# Patient Record
Sex: Male | Born: 1935 | Race: White | Hispanic: No | Marital: Married | Smoking: Never smoker
Health system: Southern US, Community
[De-identification: ages and names within clinical notes are randomized; demographics above are authoritative.]

## PROBLEM LIST (undated history)

## (undated) DIAGNOSIS — I2510 Atherosclerotic heart disease of native coronary artery without angina pectoris: Secondary | ICD-10-CM

## (undated) DIAGNOSIS — C801 Malignant (primary) neoplasm, unspecified: Secondary | ICD-10-CM

## (undated) DIAGNOSIS — I639 Cerebral infarction, unspecified: Secondary | ICD-10-CM

## (undated) DIAGNOSIS — I10 Essential (primary) hypertension: Secondary | ICD-10-CM

## (undated) DIAGNOSIS — E785 Hyperlipidemia, unspecified: Secondary | ICD-10-CM

## (undated) HISTORY — PX: CORONARY ARTERY BYPASS GRAFT: SHX141

## (undated) HISTORY — DX: Hyperlipidemia, unspecified: E78.5

## (undated) HISTORY — DX: Essential (primary) hypertension: I10

## (undated) HISTORY — DX: Atherosclerotic heart disease of native coronary artery without angina pectoris: I25.10

---

## 1994-08-30 HISTORY — PX: CORONARY ARTERY BYPASS GRAFT: SHX141

## 2001-07-11 ENCOUNTER — Encounter: Admission: RE | Admit: 2001-07-11 | Discharge: 2001-07-11 | Payer: Self-pay | Admitting: Surgery

## 2001-07-11 ENCOUNTER — Encounter: Payer: Self-pay | Admitting: Surgery

## 2001-07-13 ENCOUNTER — Encounter (INDEPENDENT_AMBULATORY_CARE_PROVIDER_SITE_OTHER): Payer: Self-pay | Admitting: Specialist

## 2001-07-13 ENCOUNTER — Ambulatory Visit (HOSPITAL_BASED_OUTPATIENT_CLINIC_OR_DEPARTMENT_OTHER): Admission: RE | Admit: 2001-07-13 | Discharge: 2001-07-13 | Payer: Self-pay | Admitting: Surgery

## 2001-08-30 HISTORY — PX: TOTAL KNEE ARTHROPLASTY: SHX125

## 2003-12-31 LAB — HM COLONOSCOPY

## 2004-09-14 ENCOUNTER — Ambulatory Visit: Payer: Self-pay | Admitting: Internal Medicine

## 2004-09-21 ENCOUNTER — Ambulatory Visit: Payer: Self-pay | Admitting: Internal Medicine

## 2004-12-21 ENCOUNTER — Ambulatory Visit: Payer: Self-pay | Admitting: Internal Medicine

## 2005-09-13 ENCOUNTER — Ambulatory Visit: Payer: Self-pay | Admitting: Internal Medicine

## 2005-09-20 ENCOUNTER — Ambulatory Visit: Payer: Self-pay | Admitting: Internal Medicine

## 2005-12-13 ENCOUNTER — Ambulatory Visit: Payer: Self-pay | Admitting: Internal Medicine

## 2006-07-07 ENCOUNTER — Ambulatory Visit: Payer: Self-pay | Admitting: Internal Medicine

## 2006-10-05 ENCOUNTER — Ambulatory Visit: Payer: Self-pay | Admitting: Internal Medicine

## 2006-10-05 LAB — CONVERTED CEMR LAB
ALT: 21 units/L (ref 0–40)
AST: 20 units/L (ref 0–37)
Albumin: 3.9 g/dL (ref 3.5–5.2)
Alkaline Phosphatase: 61 units/L (ref 39–117)
BUN: 13 mg/dL (ref 6–23)
Basophils Absolute: 0.1 10*3/uL (ref 0.0–0.1)
Basophils Relative: 1 % (ref 0.0–1.0)
Bilirubin, Direct: 0.2 mg/dL (ref 0.0–0.3)
CO2: 31 meq/L (ref 19–32)
Calcium: 9.5 mg/dL (ref 8.4–10.5)
Chloride: 104 meq/L (ref 96–112)
Cholesterol: 171 mg/dL (ref 0–200)
Creatinine, Ser: 0.8 mg/dL (ref 0.4–1.5)
Eosinophils Absolute: 0.2 10*3/uL (ref 0.0–0.6)
Eosinophils Relative: 3.4 % (ref 0.0–5.0)
GFR calc Af Amer: 123 mL/min
GFR calc non Af Amer: 102 mL/min
Glucose, Bld: 100 mg/dL — ABNORMAL HIGH (ref 70–99)
HCT: 43.9 % (ref 39.0–52.0)
HDL: 41.1 mg/dL (ref >39.0)
Hemoglobin: 15.3 g/dL (ref 13.0–17.0)
LDL Cholesterol: 108 mg/dL — ABNORMAL HIGH (ref 0–99)
Lymphocytes Relative: 27.9 % (ref 12.0–46.0)
MCHC: 34.8 g/dL (ref 30.0–36.0)
MCV: 88.2 fL (ref 78.0–100.0)
Monocytes Absolute: 0.5 10*3/uL (ref 0.2–0.7)
Monocytes Relative: 8.2 % (ref 3.0–11.0)
Neutro Abs: 3.2 10*3/uL (ref 1.4–7.7)
Neutrophils Relative %: 59.5 % (ref 43.0–77.0)
PSA: 2.09 ng/mL (ref 0.10–4.00)
Platelets: 279 10*3/uL (ref 150–400)
Potassium: 4 meq/L (ref 3.5–5.1)
RBC: 4.98 M/uL (ref 4.22–5.81)
RDW: 13.2 % (ref 11.5–14.6)
Sodium: 141 meq/L (ref 135–145)
TSH: 4.48 microintl units/mL (ref 0.35–5.50)
Total Bilirubin: 0.9 mg/dL (ref 0.3–1.2)
Total CHOL/HDL Ratio: 4.2
Total Protein: 6.8 g/dL (ref 6.0–8.3)
Triglycerides: 108 mg/dL (ref 0–149)
VLDL: 22 mg/dL (ref 0–40)
WBC: 5.5 10*3/uL (ref 4.5–10.5)

## 2006-10-13 ENCOUNTER — Ambulatory Visit: Payer: Self-pay | Admitting: Internal Medicine

## 2006-12-16 ENCOUNTER — Ambulatory Visit: Payer: Self-pay | Admitting: Internal Medicine

## 2007-04-27 DIAGNOSIS — I10 Essential (primary) hypertension: Secondary | ICD-10-CM | POA: Insufficient documentation

## 2007-04-27 DIAGNOSIS — E785 Hyperlipidemia, unspecified: Secondary | ICD-10-CM | POA: Insufficient documentation

## 2007-04-27 DIAGNOSIS — I2510 Atherosclerotic heart disease of native coronary artery without angina pectoris: Secondary | ICD-10-CM | POA: Insufficient documentation

## 2007-06-19 ENCOUNTER — Ambulatory Visit: Payer: Self-pay | Admitting: Internal Medicine

## 2007-06-19 LAB — CONVERTED CEMR LAB
ALT: 34 units/L (ref 0–53)
AST: 25 units/L (ref 0–37)
Albumin: 3.9 g/dL (ref 3.5–5.2)
Alkaline Phosphatase: 59 units/L (ref 39–117)
Bilirubin, Direct: 0.2 mg/dL (ref 0.0–0.3)
Cholesterol: 194 mg/dL (ref 0–200)
HDL: 34.2 mg/dL — ABNORMAL LOW (ref >39.0)
LDL Cholesterol: 133 mg/dL — ABNORMAL HIGH (ref 0–99)
Total Bilirubin: 0.8 mg/dL (ref 0.3–1.2)
Total CHOL/HDL Ratio: 5.7
Total Protein: 6.5 g/dL (ref 6.0–8.3)
Triglycerides: 135 mg/dL (ref 0–149)
VLDL: 27 mg/dL (ref 0–40)

## 2007-06-26 ENCOUNTER — Ambulatory Visit: Payer: Self-pay | Admitting: Internal Medicine

## 2007-06-26 LAB — CONVERTED CEMR LAB
Cholesterol, target level: 200 mg/dL
HDL goal, serum: 40 mg/dL
LDL Goal: 100 mg/dL

## 2007-09-26 ENCOUNTER — Ambulatory Visit: Payer: Self-pay | Admitting: Internal Medicine

## 2007-09-26 DIAGNOSIS — R972 Elevated prostate specific antigen [PSA]: Secondary | ICD-10-CM | POA: Insufficient documentation

## 2007-09-26 DIAGNOSIS — D649 Anemia, unspecified: Secondary | ICD-10-CM

## 2007-09-26 DIAGNOSIS — E039 Hypothyroidism, unspecified: Secondary | ICD-10-CM | POA: Insufficient documentation

## 2007-09-26 LAB — CONVERTED CEMR LAB
ALT: 27 units/L (ref 0–53)
AST: 21 units/L (ref 0–37)
Albumin: 3.9 g/dL (ref 3.5–5.2)
Alkaline Phosphatase: 51 units/L (ref 39–117)
BUN: 16 mg/dL (ref 6–23)
Basophils Absolute: 0 10*3/uL (ref 0.0–0.1)
Basophils Relative: 0.4 % (ref 0.0–1.0)
Bilirubin Urine: NEGATIVE
Bilirubin, Direct: 0.3 mg/dL (ref 0.0–0.3)
Blood in Urine, dipstick: NEGATIVE
CO2: 30 meq/L (ref 19–32)
Calcium: 9.2 mg/dL (ref 8.4–10.5)
Chloride: 102 meq/L (ref 96–112)
Cholesterol: 180 mg/dL (ref 0–200)
Creatinine, Ser: 0.9 mg/dL (ref 0.4–1.5)
Eosinophils Absolute: 0.4 10*3/uL (ref 0.0–0.6)
Eosinophils Relative: 5.3 % — ABNORMAL HIGH (ref 0.0–5.0)
GFR calc Af Amer: 107 mL/min
GFR calc non Af Amer: 89 mL/min
Glucose, Bld: 122 mg/dL — ABNORMAL HIGH (ref 70–99)
Glucose, Urine, Semiquant: NEGATIVE
HCT: 45.4 % (ref 39.0–52.0)
HDL: 31.9 mg/dL — ABNORMAL LOW (ref >39.0)
Hemoglobin: 14.9 g/dL (ref 13.0–17.0)
Ketones, urine, test strip: NEGATIVE
LDL Cholesterol: 125 mg/dL — ABNORMAL HIGH (ref 0–99)
Lymphocytes Relative: 34.2 % (ref 12.0–46.0)
MCHC: 32.8 g/dL (ref 30.0–36.0)
MCV: 90.3 fL (ref 78.0–100.0)
Monocytes Absolute: 0.7 10*3/uL (ref 0.2–0.7)
Monocytes Relative: 10 % (ref 3.0–11.0)
Neutro Abs: 3.6 10*3/uL (ref 1.4–7.7)
Neutrophils Relative %: 50.1 % (ref 43.0–77.0)
Nitrite: NEGATIVE
PSA: 2.24 ng/mL (ref 0.10–4.00)
Platelets: 247 10*3/uL (ref 150–400)
Potassium: 4.7 meq/L (ref 3.5–5.1)
Protein, U semiquant: NEGATIVE
RBC: 5.03 M/uL (ref 4.22–5.81)
RDW: 13.3 % (ref 11.5–14.6)
Sodium: 138 meq/L (ref 135–145)
Specific Gravity, Urine: 1.025
TSH: 4.44 microintl units/mL (ref 0.35–5.50)
Total Bilirubin: 1 mg/dL (ref 0.3–1.2)
Total CHOL/HDL Ratio: 5.6
Total Protein: 6.1 g/dL (ref 6.0–8.3)
Triglycerides: 115 mg/dL (ref 0–149)
Urobilinogen, UA: 0.2
VLDL: 23 mg/dL (ref 0–40)
WBC Urine, dipstick: NEGATIVE
WBC: 7.1 10*3/uL (ref 4.5–10.5)
pH: 5.5

## 2007-10-02 ENCOUNTER — Ambulatory Visit: Payer: Self-pay | Admitting: Internal Medicine

## 2007-10-02 DIAGNOSIS — M1710 Unilateral primary osteoarthritis, unspecified knee: Secondary | ICD-10-CM | POA: Insufficient documentation

## 2007-11-13 ENCOUNTER — Telehealth: Payer: Self-pay | Admitting: *Deleted

## 2008-01-02 ENCOUNTER — Ambulatory Visit: Payer: Self-pay | Admitting: Internal Medicine

## 2008-01-02 LAB — CONVERTED CEMR LAB
ALT: 36 units/L (ref 0–53)
AST: 23 units/L (ref 0–37)
Albumin: 3.9 g/dL (ref 3.5–5.2)
Alkaline Phosphatase: 58 units/L (ref 39–117)
Bilirubin, Direct: 0.2 mg/dL (ref 0.0–0.3)
Cholesterol: 166 mg/dL (ref 0–200)
HDL: 27.8 mg/dL — ABNORMAL LOW (ref >39.0)
Hgb A1c MFr Bld: 6.3 % — ABNORMAL HIGH (ref 4.6–6.0)
LDL Cholesterol: 115 mg/dL — ABNORMAL HIGH (ref 0–99)
Total Bilirubin: 1 mg/dL (ref 0.3–1.2)
Total CHOL/HDL Ratio: 6
Total Protein: 6.3 g/dL (ref 6.0–8.3)
Triglycerides: 118 mg/dL (ref 0–149)
VLDL: 24 mg/dL (ref 0–40)

## 2008-01-09 ENCOUNTER — Ambulatory Visit: Payer: Self-pay | Admitting: Internal Medicine

## 2008-05-09 ENCOUNTER — Ambulatory Visit: Payer: Self-pay | Admitting: Internal Medicine

## 2008-05-09 LAB — CONVERTED CEMR LAB: Hgb A1c MFr Bld: 6.2 % — ABNORMAL HIGH (ref 4.6–6.0)

## 2008-05-20 ENCOUNTER — Ambulatory Visit: Payer: Self-pay | Admitting: Internal Medicine

## 2008-12-02 ENCOUNTER — Ambulatory Visit: Payer: Self-pay | Admitting: Internal Medicine

## 2008-12-02 LAB — CONVERTED CEMR LAB
ALT: 27 units/L (ref 0–53)
AST: 22 units/L (ref 0–37)
Albumin: 3.8 g/dL (ref 3.5–5.2)
Alkaline Phosphatase: 54 units/L (ref 39–117)
BUN: 17 mg/dL (ref 6–23)
Basophils Absolute: 0 10*3/uL (ref 0.0–0.1)
Basophils Relative: 0.3 % (ref 0.0–3.0)
Bilirubin Urine: NEGATIVE
Bilirubin, Direct: 0.2 mg/dL (ref 0.0–0.3)
Blood in Urine, dipstick: NEGATIVE
CO2: 25 meq/L (ref 19–32)
Calcium: 9 mg/dL (ref 8.4–10.5)
Chloride: 107 meq/L (ref 96–112)
Cholesterol: 145 mg/dL (ref 0–200)
Creatinine, Ser: 0.8 mg/dL (ref 0.4–1.5)
Eosinophils Absolute: 0.3 10*3/uL (ref 0.0–0.7)
Eosinophils Relative: 3.8 % (ref 0.0–5.0)
GFR calc non Af Amer: 101.07 mL/min (ref >60)
Glucose, Bld: 115 mg/dL — ABNORMAL HIGH (ref 70–99)
Glucose, Urine, Semiquant: NEGATIVE
HCT: 42.8 % (ref 39.0–52.0)
HDL: 27.7 mg/dL — ABNORMAL LOW (ref >39.00)
Hemoglobin: 14.3 g/dL (ref 13.0–17.0)
Ketones, urine, test strip: NEGATIVE
LDL Cholesterol: 98 mg/dL (ref 0–99)
Lymphocytes Relative: 33 % (ref 12.0–46.0)
Lymphs Abs: 2.3 10*3/uL (ref 0.7–4.0)
MCHC: 33.4 g/dL (ref 30.0–36.0)
MCV: 90.8 fL (ref 78.0–100.0)
Monocytes Absolute: 0.7 10*3/uL (ref 0.1–1.0)
Monocytes Relative: 9.4 % (ref 3.0–12.0)
Neutro Abs: 3.8 10*3/uL (ref 1.4–7.7)
Neutrophils Relative %: 53.5 % (ref 43.0–77.0)
Nitrite: NEGATIVE
PSA: 2.01 ng/mL (ref 0.10–4.00)
Platelets: 237 10*3/uL (ref 150.0–400.0)
Potassium: 4.4 meq/L (ref 3.5–5.1)
Protein, U semiquant: NEGATIVE
RBC: 4.71 M/uL (ref 4.22–5.81)
RDW: 13.1 % (ref 11.5–14.6)
Sodium: 140 meq/L (ref 135–145)
Specific Gravity, Urine: 1.02
TSH: 2.68 microintl units/mL (ref 0.35–5.50)
Total Bilirubin: 1 mg/dL (ref 0.3–1.2)
Total CHOL/HDL Ratio: 5
Total Protein: 6.2 g/dL (ref 6.0–8.3)
Triglycerides: 98 mg/dL (ref 0.0–149.0)
Urobilinogen, UA: 0.2
VLDL: 19.6 mg/dL (ref 0.0–40.0)
WBC Urine, dipstick: NEGATIVE
WBC: 7.1 10*3/uL (ref 4.5–10.5)
pH: 5.5

## 2008-12-06 ENCOUNTER — Ambulatory Visit: Payer: Self-pay | Admitting: Internal Medicine

## 2009-04-11 ENCOUNTER — Ambulatory Visit: Payer: Self-pay | Admitting: Internal Medicine

## 2009-07-15 ENCOUNTER — Ambulatory Visit: Payer: Self-pay | Admitting: Internal Medicine

## 2009-07-15 DIAGNOSIS — L570 Actinic keratosis: Secondary | ICD-10-CM

## 2009-07-15 LAB — CONVERTED CEMR LAB
BUN: 16 mg/dL (ref 6–23)
Basophils Absolute: 0 10*3/uL (ref 0.0–0.1)
Basophils Relative: 0.2 % (ref 0.0–3.0)
CO2: 29 meq/L (ref 19–32)
Chloride: 101 meq/L (ref 96–112)
Direct LDL: 140.3 mg/dL
Eosinophils Absolute: 0.2 10*3/uL (ref 0.0–0.7)
HDL: 45.4 mg/dL (ref >39.00)
Lymphocytes Relative: 33.1 % (ref 12.0–46.0)
MCHC: 33.9 g/dL (ref 30.0–36.0)
Monocytes Relative: 9.8 % (ref 3.0–12.0)
Neutrophils Relative %: 54.7 % (ref 43.0–77.0)
Potassium: 4.4 meq/L (ref 3.5–5.1)
RBC: 4.72 M/uL (ref 4.22–5.81)
TSH: 2.81 microintl units/mL (ref 0.35–5.50)

## 2009-10-13 ENCOUNTER — Encounter: Payer: Self-pay | Admitting: Internal Medicine

## 2009-12-04 ENCOUNTER — Ambulatory Visit: Payer: Self-pay | Admitting: Internal Medicine

## 2009-12-04 LAB — CONVERTED CEMR LAB
AST: 31 units/L (ref 0–37)
Alkaline Phosphatase: 47 units/L (ref 39–117)
Eosinophils Relative: 4 % (ref 0.0–5.0)
GFR calc non Af Amer: 100.79 mL/min (ref >60)
LDL Cholesterol: 109 mg/dL — ABNORMAL HIGH (ref 0–99)
Monocytes Absolute: 0.6 10*3/uL (ref 0.1–1.0)
Monocytes Relative: 9 % (ref 3.0–12.0)
Neutrophils Relative %: 38.6 % — ABNORMAL LOW (ref 43.0–77.0)
PSA: 3.81 ng/mL (ref 0.10–4.00)
Platelets: 222 10*3/uL (ref 150.0–400.0)
Potassium: 4 meq/L (ref 3.5–5.1)
Sodium: 143 meq/L (ref 135–145)
TSH: 2.1 microintl units/mL (ref 0.35–5.50)
Total Bilirubin: 0.4 mg/dL (ref 0.3–1.2)
Total CHOL/HDL Ratio: 3
VLDL: 18.4 mg/dL (ref 0.0–40.0)
WBC: 6.6 10*3/uL (ref 4.5–10.5)

## 2009-12-11 ENCOUNTER — Ambulatory Visit: Payer: Self-pay | Admitting: Internal Medicine

## 2009-12-11 DIAGNOSIS — D126 Benign neoplasm of colon, unspecified: Secondary | ICD-10-CM

## 2009-12-11 LAB — CONVERTED CEMR LAB

## 2010-01-02 ENCOUNTER — Encounter: Payer: Self-pay | Admitting: Internal Medicine

## 2010-01-05 ENCOUNTER — Encounter: Payer: Self-pay | Admitting: Internal Medicine

## 2010-01-07 ENCOUNTER — Encounter: Payer: Self-pay | Admitting: Internal Medicine

## 2010-01-27 ENCOUNTER — Encounter: Payer: Self-pay | Admitting: Internal Medicine

## 2010-02-04 ENCOUNTER — Telehealth: Payer: Self-pay | Admitting: Internal Medicine

## 2010-02-11 ENCOUNTER — Ambulatory Visit: Payer: Self-pay | Admitting: Internal Medicine

## 2010-02-11 DIAGNOSIS — C189 Malignant neoplasm of colon, unspecified: Secondary | ICD-10-CM | POA: Insufficient documentation

## 2010-03-16 ENCOUNTER — Ambulatory Visit: Payer: Self-pay | Admitting: Internal Medicine

## 2010-03-16 DIAGNOSIS — K911 Postgastric surgery syndromes: Secondary | ICD-10-CM

## 2010-03-16 LAB — CONVERTED CEMR LAB
Eosinophils Absolute: 0.2 10*3/uL (ref 0.0–0.7)
Eosinophils Relative: 2 % (ref 0.0–5.0)
Folate: 4.5 ng/mL
Iron: 73 ug/dL (ref 42–165)
MCV: 90.1 fL (ref 78.0–100.0)
Monocytes Absolute: 0.5 10*3/uL (ref 0.1–1.0)
Neutrophils Relative %: 51.2 % (ref 43.0–77.0)
Platelets: 236 10*3/uL (ref 150.0–400.0)
Transferrin: 221.7 mg/dL (ref 212.0–360.0)
WBC: 7.6 10*3/uL (ref 4.5–10.5)

## 2010-07-28 ENCOUNTER — Telehealth: Payer: Self-pay | Admitting: Internal Medicine

## 2010-09-18 ENCOUNTER — Encounter: Payer: Self-pay | Admitting: Internal Medicine

## 2010-09-27 LAB — CONVERTED CEMR LAB
PSA: 2.01 ng/mL
PSA: NORMAL ng/mL

## 2010-10-01 NOTE — Letter (Signed)
Summary: [facility]  [facility]   Imported By: Maryln Gottron 01/13/2010 13:48:26  _____________________________________________________________________  External Attachment:    Type:   Image     Comment:   External Document

## 2010-10-01 NOTE — Procedures (Signed)
Summary: Colonoscopy Report/[facility]  Colonoscopy Report/[facility]   Imported By: Maryln Gottron 01/07/2010 12:25:36  _____________________________________________________________________  External Attachment:    Type:   Image     Comment:   External Document

## 2010-10-01 NOTE — Assessment & Plan Note (Signed)
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  stopped medications.        Positive major cardiovascular risk factors include male age 20 years old or older, hyperlipidemia, hypertension, and family history for ischemic heart disease (males less than 83 years old).  Negative major cardiovascular risk factors include no history of diabetes and non-tobacco-user status.        Positive history for target organ damage include ASHD (either angina/prior MI/prior CABG).  Further assessment for target organ damage reveals no history of stroke/TIA, peripheral vascular disease, renal insufficiency, or hypertensive retinopathy.     Problems Prior to Update: 1)  Colonic Polyps  (ICD-211.3) 2)  Actinic Keratosis  (ICD-702.0) 3)  Loc Osteoarthros Not Spec Prim/sec Lower Leg  (ICD-715.36) 4)  Preventive Health Care  (ICD-V70.0) 5)  Psa, Increased  (ICD-790.93) 6)  Hypothyroidism  (ICD-244.9) 7)  Anemia  (ICD-285.9) 8)  Family History of Cad Male 1st Degree Relative <50  (ICD-V17.3) 9)  Hypertension  (ICD-401.9) 10)  Hyperlipidemia  (ICD-272.4) 11)  Coronary Artery Disease  (ICD-414.00)  Medications Prior  to Update: 1)  Metoprolol Tartrate 50 Mg  Tabs (Metoprolol Tartrate) .Marland Kitchen.. 1 Two Times A Day(Out) 2)  Aspirin 325 Mg  Tbec (Aspirin) .... Take 1 Tablet By Mouth Once A Day 3)  Flonase 50 Mcg/act  Susp (Fluticasone Propionate) .... As Needed 4)  Multivitamins   Caps (Multiple Vitamin) .... Once Daily 5)  Caduet 10-20 Mg  Tabs (Amlodipine-Atorvastatin) .... One By Mouth Dailly 6)  Fish Oil Concentrate 1000 Mg  Caps (Omega-3 Fatty Acids) .... Once Daily 7)  Benicar Hct 40-12.5 Mg Tabs (Olmesartan Medoxomil-Hctz) .... One By Mouth Daily  Current Medications (verified): 1)  Metoprolol Tartrate 50 Mg  Tabs (Metoprolol Tartrate) .Marland Kitchen.. 1 Two Times A Day(Out) 2)  Aspirin 325 Mg  Tbec (Aspirin) .... Take 1 Tablet By Mouth Once A Day 3)  Flonase 50 Mcg/act  Susp (Fluticasone Propionate) .... As Needed 4)  Multivitamins   Caps (Multiple Vitamin) .... Once Daily 5)  Caduet 10-20 Mg  Tabs (Amlodipine-Atorvastatin) .... One By Mouth Dailly 6)  Fish Oil Concentrate 1000 Mg  Caps (Omega-3 Fatty Acids) .... Once Daily 7)  Benicar 20 Mg Tabs (Olmesartan Medoxomil) .... One By Mouth Daily  Allergies (verified): No Known Drug Allergies  Past History:  Family History: Last updated: 06/26/2007 Family History of CAD Male 1st degree relative <50  Social History: Last updated: 06/26/2007 Married Never Smoked Alcohol use-yes  Risk Factors: Alcohol Use: 3 (12/11/2009) Caffeine Use: 2 (06/26/2007) Exercise: no (06/26/2007)  Risk Factors: Smoking Status: never (12/11/2009) Passive Smoke Exposure: no (12/11/2009)  Past medical, surgical, family and social histories (including risk factors) reviewed, and no changes noted (except as noted below).  Past Medical History: Reviewed history from 04/27/2007 and no changes required. Coronary artery disease Hyperlipidemia Hypertension  Past Surgical History: Reviewed history from 04/27/2007 and no changes required. Coronary artery bypass graft  1996 Total  knee replacement  2003  Family History: Reviewed history from 06/26/2007 and no changes required. Family History of CAD Male 1st degree relative <50  Social History: Reviewed history from 06/26/2007 and no changes required. Married Never Smoked Alcohol use-yes  Review of Systems  The patient denies anorexia, fever, weight loss, weight gain, vision loss, decreased hearing, hoarseness, chest pain, syncope, dyspnea on exertion, peripheral edema, prolonged cough, headaches, hemoptysis, abdominal pain, melena, hematochezia, severe indigestion/heartburn, hematuria, incontinence, genital sores, muscle weakness, suspicious skin lesions, transient blindness, difficulty walking, depression, unusual weight change, abnormal bleeding, enlarged lymph nodes, angioedema, and breast masses.    Physical Exam  General:  overweight-appearing.   Head:  normocephalic and male-pattern balding.   Eyes:  vision grossly intact, pupils equal, and pupils round.   Ears:  R ear normal and L ear normal.   Nose:  no external deformity and no nasal discharge.   Mouth:  pharynx pink and moist, no exudates, and no posterior lymphoid hypertrophy.   Neck:  No deformities, masses, or tenderness noted. Lungs:  Normal respiratory effort, chest expands symmetrically. Lungs are clear to auscultation, no crackles or wheezes. Heart:  normal rate, regular rhythm, no gallop, and Grade   1/6 systolic ejection murmur.   Abdomen:  soft, non-tender, normal bowel sounds, no distention, no masses, and no guarding.   Msk:  joint tenderness and joint swelling.  left knee Pulses:  R radial normal and L radial normal.   Extremities:  trace left pedal edema and 1+ right pedal edema.   Neurologic:  alert & oriented X3, DTRs symmetrical and normal, and finger-to-nose normal.     Impression & Recommendations:  Problem # 1:  HYPERTENSION (ICD-401.9)  The following medications were removed from the medication list:    Benicar Hct 40-12.5  Mg Tabs (Olmesartan medoxomil-hctz) ..... One by mouth daily His updated medication list for this problem includes:    Metoprolol Tartrate 50 Mg Tabs (Metoprolol tartrate) .Marland Kitchen... 1 two times a day(out)    Caduet 10-20 Mg Tabs (Amlodipine-atorvastatin) ..... One by mouth dailly    Benicar 20 Mg Tabs (Olmesartan medoxomil) ..... One by mouth daily  BP today: 130/70 Prior BP: 160/82 (12/11/2009)  Prior 10 Yr Risk Heart Disease: N/A (06/26/2007)  Labs Reviewed: K+: 4.0 (12/04/2009) Creat: : 0.8 (12/04/2009)   Chol: 180 (12/04/2009)   HDL: 53.00 (12/04/2009)   LDL: 109 (12/04/2009)   TG: 92.0 (12/04/2009)  Problem # 2:  MALIGNANT NEOPLASM OF COLON UNSPECIFIED SITE (ICD-153.9) post colonoscopy  Problem # 3:  ANEMIA (ICD-285.9)  Hgb: 14.5 (12/04/2009)   Hct: 42.1 (12/04/2009)   Platelets: 222.0 (12/04/2009) RBC: 4.59 (12/04/2009)   RDW: 15.5 (12/04/2009)   WBC: 6.6 (12/04/2009) MCV: 91.8 (12/04/2009)   MCHC: 34.5 (12/04/2009) TSH: 2.10 (12/04/2009)  Problem # 4:  CORONARY ARTERY DISEASE (ICD-414.00)  stable The following medications were removed from the medication list:    Benicar Hct 40-12.5 Mg Tabs (Olmesartan medoxomil-hctz) ..... One by mouth daily His updated medication list for this problem includes:    Metoprolol Tartrate 50 Mg Tabs (Metoprolol tartrate) .Marland Kitchen... 1 two times a  day(out)    Aspirin 325 Mg Tbec (Aspirin) .Marland Kitchen... Take 1 tablet by mouth once a day    Caduet 10-20 Mg Tabs (Amlodipine-atorvastatin) ..... One by mouth dailly    Benicar 20 Mg Tabs (Olmesartan medoxomil) ..... One by mouth daily  Labs Reviewed: Chol: 180 (12/04/2009)   HDL: 53.00 (12/04/2009)   LDL: 109 (12/04/2009)   TG: 92.0 (12/04/2009)  Lipid Goals: Chol Goal: 200 (06/26/2007)   HDL Goal: 40 (06/26/2007)   LDL Goal: 100 (06/26/2007)   TG Goal: 150 (06/26/2007)  Problem # 5:  HYPERLIPIDEMIA (ICD-272.4)  His updated medication list for this problem includes:    Caduet 10-20 Mg Tabs  (Amlodipine-atorvastatin) ..... One by mouth dailly  Labs Reviewed: SGOT: 31 (12/04/2009)   SGPT: 31 (12/04/2009)  Lipid Goals: Chol Goal: 200 (06/26/2007)   HDL Goal: 40 (06/26/2007)   LDL Goal: 100 (06/26/2007)   TG Goal: 150 (06/26/2007)  Prior 10 Yr Risk Heart Disease: N/A (06/26/2007)   HDL:53.00 (12/04/2009), 45.40 (07/15/2009)  LDL:109 (12/04/2009), 98 (95/62/1308)  Chol:180 (12/04/2009), 203 (07/15/2009)  Trig:92.0 (12/04/2009), 98.0 (12/02/2008)  Complete Medication List: 1)  Metoprolol Tartrate 50 Mg Tabs (Metoprolol tartrate) .Marland Kitchen.. 1 two times a day(out) 2)  Aspirin 325 Mg Tbec (Aspirin) .... Take 1 tablet by mouth once a day 3)  Flonase 50 Mcg/act Susp (Fluticasone propionate) .... As needed 4)  Multivitamins Caps (Multiple vitamin) .... Once daily 5)  Caduet 10-20 Mg Tabs (Amlodipine-atorvastatin) .... One by mouth dailly 6)  Fish Oil Concentrate 1000 Mg Caps (Omega-3 fatty acids) .... Once daily 7)  Benicar 20 Mg Tabs (Olmesartan medoxomil) .... One by mouth daily  Hypertension Assessment/Plan:      The patient's hypertensive risk group is category C: Target organ damage and/or diabetes.  Today's blood pressure is 130/70.  His blood pressure goal is < 140/90.  Patient Instructions: 1)  keep the next appointment

## 2010-10-01 NOTE — Assessment & Plan Note (Signed)
Summary: 3 month rov/njr   Vital Signs:  Patient profile:   76 year old male Height:      73 inches Weight:      228 pounds BMI:     30.19 Temp:     98.3 degrees F oral Pulse rate:   72 / minute Resp:     14 per minute BP sitting:   130 / 80  (left arm)  Vitals Entered By: Willy Eddy, LPN (March 16, 2010 10:15 AM)  Nutrition Counseling: Patient's BMI is greater than 25 and therefore counseled on weight management options. CC: roa- after changing benicar /hct to benicar due to low bp, Hypertension Management   CC:  roa- after changing benicar /hct to benicar due to low bp and Hypertension Management.  History of Present Illness: follow up for HTN still has some loose stools and dumping ( stools after each meal) ( colectomy for cancer) monitering of HTN and LIPids as well ans monitering of malabsorbtion ( b12 and iron)  Hypertension History:      He denies headache, chest pain, palpitations, dyspnea with exertion, orthopnea, PND, peripheral edema, visual symptoms, neurologic problems, syncope, and side effects from treatment.        Positive major cardiovascular risk factors include male age 58 years old or older, hyperlipidemia, hypertension, and family history for ischemic heart disease (males less than 49 years old).  Negative major cardiovascular risk factors include no history of diabetes and non-tobacco-user status.        Positive history for target organ damage include ASHD (either angina/prior MI/prior CABG).  Further assessment for target organ damage reveals no history of stroke/TIA, peripheral vascular disease, renal insufficiency, or hypertensive retinopathy.     Preventive Screening-Counseling & Management  Alcohol-Tobacco     Alcohol drinks/day: 3     Alcohol type: wine and acohol     Smoking Status: never     Passive Smoke Exposure: no  Problems Prior to Update: 1)  Malignant Neoplasm of Colon Unspecified Site  (ICD-153.9) 2)  Colonic Polyps   (ICD-211.3) 3)  Actinic Keratosis  (ICD-702.0) 4)  Loc Osteoarthros Not Spec Prim/sec Lower Leg  (ICD-715.36) 5)  Preventive Health Care  (ICD-V70.0) 6)  Psa, Increased  (ICD-790.93) 7)  Hypothyroidism  (ICD-244.9) 8)  Anemia  (ICD-285.9) 9)  Family History of Cad Male 1st Degree Relative <50  (ICD-V17.3) 10)  Hypertension  (ICD-401.9) 11)  Hyperlipidemia  (ICD-272.4) 12)  Coronary Artery Disease  (ICD-414.00)  Current Problems (verified): 1)  Malignant Neoplasm of Colon Unspecified Site  (ICD-153.9) 2)  Colonic Polyps  (ICD-211.3) 3)  Actinic Keratosis  (ICD-702.0) 4)  Loc Osteoarthros Not Spec Prim/sec Lower Leg  (ICD-715.36) 5)  Preventive Health Care  (ICD-V70.0) 6)  Psa, Increased  (ICD-790.93) 7)  Hypothyroidism  (ICD-244.9) 8)  Anemia  (ICD-285.9) 9)  Family History of Cad Male 1st Degree Relative <50  (ICD-V17.3) 10)  Hypertension  (ICD-401.9) 11)  Hyperlipidemia  (ICD-272.4) 12)  Coronary Artery Disease  (ICD-414.00)  Medications Prior to Update: 1)  Metoprolol Tartrate 50 Mg  Tabs (Metoprolol Tartrate) .Marland Kitchen.. 1 Two Times A Day(Out) 2)  Aspirin 325 Mg  Tbec (Aspirin) .... Take 1 Tablet By Mouth Once A Day 3)  Flonase 50 Mcg/act  Susp (Fluticasone Propionate) .... As Needed 4)  Multivitamins   Caps (Multiple Vitamin) .... Once Daily 5)  Caduet 10-20 Mg  Tabs (Amlodipine-Atorvastatin) .... One By Mouth Dailly 6)  Fish Oil Concentrate 1000 Mg  Caps (Omega-3  Fatty Acids) .... Once Daily 7)  Benicar 20 Mg Tabs (Olmesartan Medoxomil) .... One By Mouth Daily  Current Medications (verified): 1)  Metoprolol Tartrate 50 Mg  Tabs (Metoprolol Tartrate) .Marland Kitchen.. 1 Two Times A Day(Out) 2)  Aspirin 325 Mg  Tbec (Aspirin) .... Take 1 Tablet By Mouth Once A Day 3)  Flonase 50 Mcg/act  Susp (Fluticasone Propionate) .... As Needed 4)  Multivitamins   Caps (Multiple Vitamin) .... Once Daily 5)  Caduet 10-20 Mg  Tabs (Amlodipine-Atorvastatin) .... One By Mouth Dailly 6)  Fish Oil Concentrate  1000 Mg  Caps (Omega-3 Fatty Acids) .... Once Daily 7)  Benicar 20 Mg Tabs (Olmesartan Medoxomil) .... One By Mouth Daily  Allergies (verified): No Known Drug Allergies  Past History:  Family History: Last updated: 06/26/2007 Family History of CAD Male 1st degree relative <50  Social History: Last updated: 06/26/2007 Married Never Smoked Alcohol use-yes  Risk Factors: Alcohol Use: 3 (03/16/2010) Caffeine Use: 2 (06/26/2007) Exercise: no (06/26/2007)  Risk Factors: Smoking Status: never (03/16/2010) Passive Smoke Exposure: no (03/16/2010)  Past medical, surgical, family and social histories (including risk factors) reviewed, and no changes noted (except as noted below).  Past Medical History: Reviewed history from 04/27/2007 and no changes required. Coronary artery disease Hyperlipidemia Hypertension  Past Surgical History: Reviewed history from 04/27/2007 and no changes required. Coronary artery bypass graft  1996 Total knee replacement  2003  Family History: Reviewed history from 06/26/2007 and no changes required. Family History of CAD Male 1st degree relative <50  Social History: Reviewed history from 06/26/2007 and no changes required. Married Never Smoked Alcohol use-yes  Review of Systems  The patient denies anorexia, fever, weight loss, weight gain, vision loss, decreased hearing, hoarseness, chest pain, syncope, dyspnea on exertion, peripheral edema, prolonged cough, headaches, hemoptysis, abdominal pain, melena, hematochezia, severe indigestion/heartburn, hematuria, incontinence, genital sores, muscle weakness, suspicious skin lesions, transient blindness, difficulty walking, depression, unusual weight change, abnormal bleeding, enlarged lymph nodes, angioedema, and breast masses.    Physical Exam  General:  overweight-appearing.   Head:  normocephalic and male-pattern balding.   Eyes:  vision grossly intact, pupils equal, and pupils round.   Ears:   R ear normal and L ear normal.   Nose:  no external deformity and no nasal discharge.   Mouth:  pharynx pink and moist, no exudates, and no posterior lymphoid hypertrophy.   Neck:  No deformities, masses, or tenderness noted. Lungs:  Normal respiratory effort, chest expands symmetrically. Lungs are clear to auscultation, no crackles or wheezes. Heart:  normal rate, regular rhythm, no gallop, and Grade   1/6 systolic ejection murmur.   Abdomen:  soft, non-tender, normal bowel sounds, no distention, no masses, and no guarding.   Msk:  joint tenderness and joint swelling.  left knee Pulses:  R radial normal and L radial normal.   Extremities:  trace left pedal edema and 1+ right pedal edema.   Neurologic:  alert & oriented X3, DTRs symmetrical and normal, and finger-to-nose normal.   Skin:  solar damage and petechiae.   Psych:  Oriented X3 and not anxious appearing.     Impression & Recommendations:  Problem # 1:  DUMPING SYNDROME (ICD-564.2) post colectomy add ing fiber  Problem # 2:  PSA, INCREASED (ICD-790.93)  Orders: Venipuncture (09811) TLB-PSA (Prostate Specific Antigen) (84153-PSA)  Problem # 3:  HYPOTHYROIDISM (ICD-244.9) Assessment: Unchanged  Orders: TLB-TSH (Thyroid Stimulating Hormone) (84443-TSH)  Labs Reviewed: TSH: 2.10 (12/04/2009)    HgBA1c: 6.2 (05/09/2008) Chol:  180 (12/04/2009)   HDL: 53.00 (12/04/2009)   LDL: 109 (12/04/2009)   TG: 92.0 (12/04/2009)  Problem # 4:  ANEMIA (ICD-285.9) Assessment: Unchanged  Orders: TLB-CBC Platelet - w/Differential (85025-CBCD) TLB-IBC Pnl (Iron/FE;Transferrin) (83550-IBC) TLB-B12 + Folate Pnl (16109_60454-U98/JXB)  Hgb: 14.5 (12/04/2009)   Hct: 42.1 (12/04/2009)   Platelets: 222.0 (12/04/2009) RBC: 4.59 (12/04/2009)   RDW: 15.5 (12/04/2009)   WBC: 6.6 (12/04/2009) MCV: 91.8 (12/04/2009)   MCHC: 34.5 (12/04/2009) TSH: 2.10 (12/04/2009)  Problem # 5:  HYPERTENSION (ICD-401.9)  His updated medication list for this  problem includes:    Metoprolol Tartrate 50 Mg Tabs (Metoprolol tartrate) .Marland Kitchen... 1 two times a day(out)    Caduet 10-20 Mg Tabs (Amlodipine-atorvastatin) ..... One by mouth dailly    Benicar 20 Mg Tabs (Olmesartan medoxomil) ..... One by mouth daily  BP today: 130/80 Prior BP: 130/70 (02/11/2010)  Prior 10 Yr Risk Heart Disease: N/A (06/26/2007)  Labs Reviewed: K+: 4.0 (12/04/2009) Creat: : 0.8 (12/04/2009)   Chol: 180 (12/04/2009)   HDL: 53.00 (12/04/2009)   LDL: 109 (12/04/2009)   TG: 92.0 (12/04/2009)  Complete Medication List: 1)  Metoprolol Tartrate 50 Mg Tabs (Metoprolol tartrate) .Marland Kitchen.. 1 two times a day(out) 2)  Aspirin 325 Mg Tbec (Aspirin) .... Take 1 tablet by mouth once a day 3)  Flonase 50 Mcg/act Susp (Fluticasone propionate) .... As needed 4)  Multivitamins Caps (Multiple vitamin) .... Once daily 5)  Caduet 10-20 Mg Tabs (Amlodipine-atorvastatin) .... One by mouth dailly 6)  Fish Oil Concentrate 1000 Mg Caps (Omega-3 fatty acids) .... Once daily 7)  Benicar 20 Mg Tabs (Olmesartan medoxomil) .... One by mouth daily  Hypertension Assessment/Plan:      The patient's hypertensive risk group is category C: Target organ damage and/or diabetes.  Today's blood pressure is 130/80.  His blood pressure goal is < 140/90.  Patient Instructions: 1)  try benifiber  for the suppliment for fiber 2)  Please schedule a follow-up appointment in 6 months.   3)  schedule this as a prevention visit for medicare in a 30 min spot

## 2010-10-01 NOTE — Letter (Signed)
Summary: [facility]  [facility]   Imported By: Maryln Gottron 10/20/2009 15:52:57  _____________________________________________________________________  External Attachment:    Type:   Image     Comment:   External Document

## 2010-10-01 NOTE — Progress Notes (Signed)
  Phone Note Call from Patient   Caller: Patient Call For: Stacie Glaze MD Summary of Call: Pt needs Benicar 40/12.5 and Caduet 10/20 called to [facility] [place]) [phone] Wants to make sure Dr. Lovell Sheehan wants him to stay on this since it was changes when he had the colectomy and lost a large amount of weight. [phone] Initial call taken by: Lynann Beaver CMA AAMA,  July 28, 2010 10:42 AM    Prescriptions: BENICAR 20 MG TABS (OLMESARTAN MEDOXOMIL) one by mouth daily  #90 x 3   Entered by:   Willy Eddy, LPN   Authorized by:   Stacie Glaze MD   Signed by:   Willy Eddy, LPN on 25/36/6440   Method used:   [facility]ically to        [facility] (retail[facility]       [address] [place], [place]  [address]       Ph: [phone]       Fax: [phone]   RxID:   [id number] CADUET 10-20 MG  TABS (AMLODIPINE-ATORVASTATIN) one by mouth dailly  #90 x 3   Entered by:   Willy Eddy, LPN   Authorized by:   Stacie Glaze MD   Signed by:   Willy Eddy, LPN on 57/32/2025   Method used:   [facility] to        [facility]       [address] [place], [place]  [address]       Ph: [phone]       Fax: [phone]   RxID:   [id number]

## 2010-10-01 NOTE — Letter (Signed)
Summary: [facility]   Imported By: Maryln Gottron 01/14/2010 09:58:07  _____________________________________________________________________  External Attachment:    Type:   Image     Comment:   External Document

## 2010-10-01 NOTE — Assessment & Plan Note (Signed)
Summary: CPX/NJR   Vital Signs:  Patient profile:   76 year old male Height:      73 inches Weight:      250 pounds BMI:     33.10 Temp:     98.2 degrees F oral Pulse rate:   72 / minute Resp:     14 per minute BP sitting:   160 / 82  (left arm)  Vitals Entered By: Willy Eddy, LPN (December 11, 2009 11:30 AM) CC: annual visit for disease management Nutritional Status BMI of > 30 = obese Nutritional Status Detail weight loss an diet issues reviewed  Does patient need assistance? Functional Status Self care, Cook/clean, Shopping, Social activities Ambulation Normal  Vision Screening:Left eye with correction: 20 / 20 Right eye with correction: 20 / 20 Both eyes with correction: 20 / 20  Color vision testing: normal     40db HL: Left  Right  Audiometry Comment: to whispered vice at 10 feet normal response bilateraaly    CC:  annual visit for disease management.  History of Present Illness: the pt presents forst annual preventon visit  The pt has no evicence of dementia or depression has a good functional status and is up to date with all recommended immunizations and screens except those order today pts referral were reviewed an dhas planned referral for TKR  ( redo ) left   Preventive Screening-Counseling & Management  Alcohol-Tobacco     Alcohol drinks/day: 3     Alcohol type: wine and acohol     Smoking Status: never     Passive Smoke Exposure: no  Current Problems (verified): 1)  Actinic Keratosis  (ICD-702.0) 2)  Loc Osteoarthros Not Spec Prim/sec Lower Leg  (ICD-715.36) 3)  Preventive Health Care  (ICD-V70.0) 4)  Psa, Increased  (ICD-790.93) 5)  Hypothyroidism  (ICD-244.9) 6)  Anemia  (ICD-285.9) 7)  Family History of Cad Male 1st Degree Relative <50  (ICD-V17.3) 8)  Hypertension  (ICD-401.9) 9)  Hyperlipidemia  (ICD-272.4) 10)  Coronary Artery Disease  (ICD-414.00)  Current Medications (verified): 1)  Metoprolol Tartrate 50 Mg  Tabs  (Metoprolol Tartrate) .Marland Kitchen.. 1 Two Times A Day(Out) 2)  Aspirin 325 Mg  Tbec (Aspirin) .... Take 1 Tablet By Mouth Once A Day 3)  Flonase 50 Mcg/act  Susp (Fluticasone Propionate) .... As Needed 4)  Multivitamins   Caps (Multiple Vitamin) .... Once Daily 5)  Caduet 10-20 Mg  Tabs (Amlodipine-Atorvastatin) .... One By Mouth Dailly 6)  Fish Oil Concentrate 1000 Mg  Caps (Omega-3 Fatty Acids) .... Once Daily 7)  Benicar 40 Mg Tabs (Olmesartan Medoxomil) .... One By Mouth Daily  Allergies (verified): No Known Drug Allergies  Social History: Smoking Status:  never   Complete Medication List: 1)  Metoprolol Tartrate 50 Mg Tabs (Metoprolol tartrate) .Marland Kitchen.. 1 two times a day(out) 2)  Aspirin 325 Mg Tbec (Aspirin) .... Take 1 tablet by mouth once a day 3)  Flonase 50 Mcg/act Susp (Fluticasone propionate) .... As needed 4)  Multivitamins Caps (Multiple vitamin) .... Once daily 5)  Caduet 10-20 Mg Tabs (Amlodipine-atorvastatin) .... One by mouth dailly 6)  Fish Oil Concentrate 1000 Mg Caps (Omega-3 fatty acids) .... Once daily 7)  Benicar 40 Mg Tabs (Olmesartan medoxomil) .... One by mouth daily  Other Orders: Gastroenterology Referral (GI) First annual wellness visit with prevention plan  [phone])  Patient Instructions: 1)  Please schedule a follow-up appointment in 3 months. 2)  moniter bloodpressure keep record and return if blood  pressure does not stay under 140/90 Prescriptions: BENICAR 40 MG TABS (OLMESARTAN MEDOXOMIL) one by mouth daily  #90 x 3   Entered by:   Willy Eddy, LPN   Authorized by:   Stacie Glaze MD   Signed by:   Willy Eddy, LPN on 81/19/1478   Method used:   Print then Give to Patient   RxID:   [id number] CADUET 10-20 MG  TABS (AMLODIPINE-ATORVASTATIN) one by mouth dailly  #90 x 3   Entered by:   Willy Eddy, LPN   Authorized by:   Stacie Glaze MD   Signed by:   Willy Eddy, LPN on 62/95/2841   Method used:   Print then Give to  Patient   RxID:   [id number] IHKVQQV 50 MCG/ACT  SUSP (FLUTICASONE PROPIONATE) as needed  #3 units x 3   Entered by:   Willy Eddy, LPN   Authorized by:   Stacie Glaze MD   Signed by:   Willy Eddy, LPN on 95/63/8756   Method used:   Print then Give to Patient   RxID:   [id number] METOPROLOL TARTRATE 50 MG  TABS (METOPROLOL TARTRATE) 1 two times a day(out)  #180 x 3   Entered by:   Willy Eddy, LPN   Authorized by:   Stacie Glaze MD   Signed by:   Willy Eddy, LPN on 01/60/1093   Method used:   Print then Give to Patient   RxID:   [id number]     Prevention & Chronic Care Immunizations   Influenza vaccine: Fluvax 3+  (07/15/2009)   Influenza vaccine due: 04/30/2010    Tetanus booster: 08/30/2005: historical   Tetanus booster due: 08/31/2015    Pneumococcal vaccine: Pneumovax (Medicare)  (07/15/2009)   Pneumococcal vaccine deferral: Not indicated  (12/11/2009)    H. zoster vaccine: Not documented   H. zoster vaccine deferral: Deferred  (12/11/2009)    Immunization comments: pnemovax complted over age 48 and has been vaccinated  Colorectal Screening   Hemoccult: historical  (08/31/2003)    Colonoscopy: abnormal  (12/11/2003)   Colonoscopy action/deferral: GI referral  (12/11/2009)   Colonoscopy due: 12/10/2008  Other Screening   PSA: 3.81  (12/04/2009)   PSA action/deferral: Discussed-PSA requested  (12/11/2009)   Smoking status: never  (12/11/2009)  Lipids   Total Cholesterol: 180  (12/04/2009)   Lipid panel action/deferral: Not indicated   LDL: 109  (12/04/2009)   LDL Direct: 140.3  (07/15/2009)   HDL: 53.00  (12/04/2009)   Triglycerides: 92.0  (12/04/2009)   Lipid panel due: 06/12/2010    SGOT (AST): 31  (12/04/2009)   BMP action: Not indicated   SGPT (ALT): 31  (12/04/2009)   Alkaline phosphatase: 47  (12/04/2009)   Total bilirubin: 0.4  (12/04/2009)   Liver panel due: 06/12/2010    Lipid flowsheet  reviewed?: Yes   Progress toward LDL goal: At goal    Stage of readiness to change (lipid management): Maintenance  Hypertension   Last Blood Pressure: 160 / 82  (12/11/2009)   Serum creatinine: 0.8  (12/04/2009)   BMP action: Not indicated   Serum potassium 4.0  (12/04/2009)    Hypertension flowsheet reviewed?: Yes   Progress toward BP goal: Deteriorated    Stage of readiness to change (hypertension management): Action   Hypertension comments: weight gain has superceded control  Self-Management Support :    Patient will work on the following items until the next clinic  visit to reach self-care goals:     Medications and monitoring: take my medicines every day  (12/11/2009)     Eating: eat more vegetables  (12/11/2009)     Activity: take a 30 minute walk every day  (12/11/2009)    Hypertension self-management support: BP self-monitoring log, Education handout  (12/11/2009)   Hypertension education handout printed    Lipid self-management support: Lipid monitoring log, Written self-care plan  (12/11/2009)   Lipid self-care plan printed.   Nursing Instructions: GI referral for screening colonoscopy (see order)   Appended Document: CPX/NJR     History of Present Illness: weight gain due to need for knee revison ad lack of walking  andt weight gain  Hypertension Follow-Up      This is a 76 year old man who presents for Hypertension follow-up.  The patient denies lightheadedness, urinary frequency, headaches, edema, impotence, rash, and fatigue.  The patient denies the following associated symptoms: chest pain, chest pressure, exercise intolerance, dyspnea, palpitations, syncope, leg edema, and pedal edema.  Compliance with medications (by patient report) has been near 100%.  The patient reports that dietary compliance has been fair.  The patient reports no exercise.    Hyperlipidemia Follow-Up      The patient also presents for Hyperlipidemia follow-up.  at goal.  The  patient denies muscle aches, GI upset, abdominal pain, flushing, itching, constipation, diarrhea, and fatigue.  The patient denies the following symptoms: chest pain/pressure, exercise intolerance, dypsnea, palpitations, syncope, and pedal edema.  Compliance with medications (by patient report) has been near 100%.  Dietary compliance has been good.  The patient reports no exercise.  Adjunctive measures currently used by the patient include fish oil supplements and weight reduction.    Problems Prior to Update: 1)  Colonic Polyps  (ICD-211.3) 2)  Special Screening For Malignant Neoplasms Colon  (ICD-V76.51) 3)  Actinic Keratosis  (ICD-702.0) 4)  Loc Osteoarthros Not Spec Prim/sec Lower Leg  (ICD-715.36) 5)  Preventive Health Care  (ICD-V70.0) 6)  Psa, Increased  (ICD-790.93) 7)  Hypothyroidism  (ICD-244.9) 8)  Anemia  (ICD-285.9) 9)  Family History of Cad Male 1st Degree Relative <50  (ICD-V17.3) 10)  Hypertension  (ICD-401.9) 11)  Hyperlipidemia  (ICD-272.4) 12)  Coronary Artery Disease  (ICD-414.00)  Medications Prior to Update: 1)  Metoprolol Tartrate 50 Mg  Tabs (Metoprolol Tartrate) .Marland Kitchen.. 1 Two Times A Day(Out) 2)  Aspirin 325 Mg  Tbec (Aspirin) .... Take 1 Tablet By Mouth Once A Day 3)  Flonase 50 Mcg/act  Susp (Fluticasone Propionate) .... As Needed 4)  Multivitamins   Caps (Multiple Vitamin) .... Once Daily 5)  Caduet 10-20 Mg  Tabs (Amlodipine-Atorvastatin) .... One By Mouth Dailly 6)  Fish Oil Concentrate 1000 Mg  Caps (Omega-3 Fatty Acids) .... Once Daily 7)  Benicar 40 Mg Tabs (Olmesartan Medoxomil) .... One By Mouth Daily  Current Medications (verified): 1)  Metoprolol Tartrate 50 Mg  Tabs (Metoprolol Tartrate) .Marland Kitchen.. 1 Two Times A Day(Out) 2)  Aspirin 325 Mg  Tbec (Aspirin) .... Take 1 Tablet By Mouth Once A Day 3)  Flonase 50 Mcg/act  Susp (Fluticasone Propionate) .... As Needed 4)  Multivitamins   Caps (Multiple Vitamin) .... Once Daily 5)  Caduet 10-20 Mg  Tabs  (Amlodipine-Atorvastatin) .... One By Mouth Dailly 6)  Fish Oil Concentrate 1000 Mg  Caps (Omega-3 Fatty Acids) .... Once Daily 7)  Benicar 40 Mg Tabs (Olmesartan Medoxomil) .... One By Mouth Daily  Allergies (verified): No Known Drug Allergies  Past History:  Family History: Last updated: 06/26/2007 Family History of CAD Male 1st degree relative <50  Social History: Last updated: 06/26/2007 Married Never Smoked Alcohol use-yes  Risk Factors: Alcohol Use: 3 (12/11/2009) Caffeine Use: 2 (06/26/2007) Exercise: no (06/26/2007)  Risk Factors: Smoking Status: never (12/11/2009) Passive Smoke Exposure: no (12/11/2009)  Past medical, surgical, family and social histories (including risk factors) reviewed, and no changes noted (except as noted below).  Past Medical History: Reviewed history from 04/27/2007 and no changes required. Coronary artery disease Hyperlipidemia Hypertension  Past Surgical History: Reviewed history from 04/27/2007 and no changes required. Coronary artery bypass graft  1996 Total knee replacement  2003  Family History: Reviewed history from 06/26/2007 and no changes required. Family History of CAD Male 1st degree relative <50  Social History: Reviewed history from 06/26/2007 and no changes required. Married Never Smoked Alcohol use-yes  Review of Systems  The patient denies anorexia, fever, weight loss, weight gain, vision loss, decreased hearing, hoarseness, chest pain, syncope, dyspnea on exertion, peripheral edema, prolonged cough, headaches, hemoptysis, abdominal pain, melena, hematochezia, severe indigestion/heartburn, hematuria, incontinence, genital sores, muscle weakness, suspicious skin lesions, transient blindness, difficulty walking, depression, unusual weight change, abnormal bleeding, enlarged lymph nodes, angioedema, and breast masses.    Physical Exam  General:  overweight-appearing.   Head:  normocephalic and male-pattern  balding.   Eyes:  vision grossly intact, pupils equal, and pupils round.   Ears:  R ear normal and L ear normal.   Nose:  no external deformity and no nasal discharge.   Mouth:  pharynx pink and moist, no exudates, and no posterior lymphoid hypertrophy.   Neck:  No deformities, masses, or tenderness noted. Lungs:  Normal respiratory effort, chest expands symmetrically. Lungs are clear to auscultation, no crackles or wheezes. Heart:  normal rate, regular rhythm, no gallop, and Grade   1/6 systolic ejection murmur.   Abdomen:  soft, non-tender, normal bowel sounds, no distention, no masses, and no guarding.   Msk:  joint tenderness and joint swelling.  left knee Pulses:  R radial normal and L radial normal.   Extremities:  trace left pedal edema and 1+ right pedal edema.   Neurologic:  alert & oriented X3, DTRs symmetrical and normal, and finger-to-nose normal.   Cervical Nodes:  No lymphadenopathy noted Psych:  Oriented X3 and not anxious appearing.     Impression & Recommendations:  Problem # 1:  HYPERTENSION (ICD-401.9) Assessment Deteriorated  addng hctz to overcome the proble with the weight The following medications were removed from the medication list:    Benicar 40 Mg Tabs (Olmesartan medoxomil) ..... One by mouth daily His updated medication list for this problem includes:    Metoprolol Tartrate 50 Mg Tabs (Metoprolol tartrate) .Marland Kitchen... 1 two times a day(out)    Caduet 10-20 Mg Tabs (Amlodipine-atorvastatin) ..... One by mouth dailly    Benicar Hct 40-12.5 Mg Tabs (Olmesartan medoxomil-hctz) ..... One by mouth daily  Prior BP: 160/82 (12/11/2009)  Prior 10 Yr Risk Heart Disease: N/A (06/26/2007)  Labs Reviewed: K+: 4.0 (12/04/2009) Creat: : 0.8 (12/04/2009)   Chol: 180 (12/04/2009)   HDL: 53.00 (12/04/2009)   LDL: 109 (12/04/2009)   TG: 92.0 (12/04/2009)  Problem # 2:  HYPERLIPIDEMIA (ICD-272.4) Assessment: Unchanged  at goal on libitor His updated medication list for  this problem includes:    Caduet 10-20 Mg Tabs (Amlodipine-atorvastatin) ..... One by mouth dailly  Labs Reviewed: SGOT: 31 (12/04/2009)   SGPT: 31 (12/04/2009)  Lipid Goals: Chol  Goal: 200 (06/26/2007)   HDL Goal: 40 (06/26/2007)   LDL Goal: 100 (06/26/2007)   TG Goal: 150 (06/26/2007)  Prior 10 Yr Risk Heart Disease: N/A (06/26/2007)   HDL:53.00 (12/04/2009), 45.40 (07/15/2009)  LDL:109 (12/04/2009), 98 (82/95/6213)  Chol:180 (12/04/2009), 203 (07/15/2009)  Trig:92.0 (12/04/2009), 98.0 (12/02/2008)  Problem # 3:  COLONIC POLYPS (ICD-211.3) referal made fro colon  Complete Medication List: 1)  Metoprolol Tartrate 50 Mg Tabs (Metoprolol tartrate) .Marland Kitchen.. 1 two times a day(out) 2)  Aspirin 325 Mg Tbec (Aspirin) .... Take 1 tablet by mouth once a day 3)  Flonase 50 Mcg/act Susp (Fluticasone propionate) .... As needed 4)  Multivitamins Caps (Multiple vitamin) .... Once daily 5)  Caduet 10-20 Mg Tabs (Amlodipine-atorvastatin) .... One by mouth dailly 6)  Fish Oil Concentrate 1000 Mg Caps (Omega-3 fatty acids) .... Once daily 7)  Benicar Hct 40-12.5 Mg Tabs (Olmesartan medoxomil-hctz) .... One by mouth daily  Patient Instructions: 1)  Please schedule a follow-up appointment in 3 months.

## 2010-10-01 NOTE — Letter (Signed)
Summary: [facility]-Sigmoid colon mass   [facility]-Sigmoid colon mass   Imported By: Maryln Gottron 03/18/2010 10:53:13  _____________________________________________________________________  External Attachment:    Type:   Image     Comment:   External Document

## 2010-10-01 NOTE — Progress Notes (Signed)
  Phone Note Call from Patient   Reason for Call: Acute Illness, Privacy/Consent Authorization Summary of Call: have bonnye call pt  Follow-up for Phone Call        pt had a colon resection for mass that was found during routine colonoscopy.  He has lost about   40 pounds and is now down to 218--his blood pressure has dropped drastically--averaging 95/62;85/55;83/42---concerned about the 3 bp  mnds he takes- benicar,caduet and metoprolol--please advise--fells good but does feel a little faint at times Follow-up by: Willy Eddy, LPN,  February 05, 9603 10:18 AM  Additional Follow-up for Phone Call Additional follow up Details #1::        stop benicar and see pt in 2weeks Additional Follow-up by: Willy Eddy, LPN,  February 05, 5408 10:21 AM     Appended Document:  appointment made for june 15 and pt informed to hold benicar

## 2010-10-07 NOTE — Letter (Signed)
Summary: [facility]   Imported By: Maryln Gottron 09/29/2010 10:16:10  _____________________________________________________________________  External Attachment:    Type:   Image     Comment:   External Document

## 2010-12-14 ENCOUNTER — Encounter: Payer: Self-pay | Admitting: Internal Medicine

## 2010-12-31 ENCOUNTER — Encounter: Payer: Self-pay | Admitting: Internal Medicine

## 2011-01-06 ENCOUNTER — Encounter: Payer: Self-pay | Admitting: Internal Medicine

## 2011-01-06 ENCOUNTER — Ambulatory Visit (INDEPENDENT_AMBULATORY_CARE_PROVIDER_SITE_OTHER): Payer: Medicare Other | Admitting: Internal Medicine

## 2011-01-06 DIAGNOSIS — E785 Hyperlipidemia, unspecified: Secondary | ICD-10-CM

## 2011-01-06 DIAGNOSIS — R972 Elevated prostate specific antigen [PSA]: Secondary | ICD-10-CM

## 2011-01-06 DIAGNOSIS — Z0000 Encounter for general adult medical examination without abnormal findings: Secondary | ICD-10-CM

## 2011-01-06 DIAGNOSIS — I10 Essential (primary) hypertension: Secondary | ICD-10-CM

## 2011-01-06 DIAGNOSIS — I2510 Atherosclerotic heart disease of native coronary artery without angina pectoris: Secondary | ICD-10-CM

## 2011-01-06 DIAGNOSIS — E039 Hypothyroidism, unspecified: Secondary | ICD-10-CM

## 2011-01-06 LAB — LIPID PANEL
HDL: 51.1 mg/dL (ref >39.00)
Total CHOL/HDL Ratio: 4
Triglycerides: 108 mg/dL (ref 0.0–149.0)

## 2011-01-06 LAB — POCT URINALYSIS DIPSTICK
Glucose, UA: NEGATIVE
Ketones, UA: NEGATIVE
Spec Grav, UA: 1.015
Urobilinogen, UA: 0.2

## 2011-01-06 LAB — CBC WITH DIFFERENTIAL/PLATELET
Basophils Relative: 0.6 % (ref 0.0–3.0)
Eosinophils Absolute: 0.1 10*3/uL (ref 0.0–0.7)
Eosinophils Relative: 1.3 % (ref 0.0–5.0)
Hemoglobin: 15.3 g/dL (ref 13.0–17.0)
Lymphocytes Relative: 37 % (ref 12.0–46.0)
MCHC: 34.1 g/dL (ref 30.0–36.0)
Neutro Abs: 4.7 10*3/uL (ref 1.4–7.7)
Neutrophils Relative %: 53 % (ref 43.0–77.0)
RBC: 4.84 Mil/uL (ref 4.22–5.81)
WBC: 8.9 10*3/uL (ref 4.5–10.5)

## 2011-01-06 LAB — BASIC METABOLIC PANEL
BUN: 14 mg/dL (ref 6–23)
CO2: 29 mEq/L (ref 19–32)
Chloride: 100 mEq/L (ref 96–112)
Creatinine, Ser: 1 mg/dL (ref 0.4–1.5)
Potassium: 4.6 mEq/L (ref 3.5–5.1)

## 2011-01-06 NOTE — Progress Notes (Signed)
Subjective:    Patient ID: Jermaine Castaneda, male    DOB: 1934-12-21, 76 y.o.   MRN: [id number]  HPI Medicare wellness exam please see smart set.  Patient is also followed for hypertension moderately elevated blood pressure today weight gain he lost significant amount of weight when his colon surgery he occurred for colon cancer but since then he has progressively gained weight back now he has problems with his knees worsening hypertension and is at risk for adult onset diabetes.  We talked about the risks of his weight gain and plan to lose weight.     Review of Systems     Objective:   Physical Exam        Assessment & Plan:  Blood pressure is best treated with weight loss and exercise is limited because of his knee pain he will consider her a position with an orthopedist for talk about left total knee replacement.  However at this point diastolic numbers are good systolic murmur slightly elevated at 152 a calorie restricted diet and salt restriction are first line of treatment for this we will also monitor his lipid and liver to make sure that he is adequately treated with for his hyperlipidemia with his history of coronary artery disease we'll followup in 3 months Subjective:    Jermaine Castaneda is a 76 y.o. male who presents for Medicare Annual/Subsequent preventive examination.   Preventive Screening-Counseling & Management  Tobacco History  Smoking status  . Never Smoker   Smokeless tobacco  . Not on file    Problems Prior to Visit 1.   Current Problems (verified) Patient Active Problem List  Diagnoses  . MALIGNANT NEOPLASM OF COLON UNSPECIFIED SITE  . COLONIC POLYPS  . HYPOTHYROIDISM  . HYPERLIPIDEMIA  . ANEMIA  . HYPERTENSION  . CORONARY ARTERY DISEASE  . DUMPING SYNDROME  . ACTINIC KERATOSIS  . LOC OSTEOARTHROS NOT SPEC PRIM/SEC LOWER LEG  . PSA, INCREASED    Medications Prior to Visit Current Outpatient Prescriptions on File Prior to Visit  Medication  Sig Dispense Refill  . amlodipine-atorvastatin (CADUET) 10-20 MG per tablet Take 1 tablet by mouth daily.        Marland Kitchen aspirin 325 MG tablet Take 325 mg by mouth daily.        . fish oil-omega-3 fatty acids 1000 MG capsule Take 2 g by mouth daily.        . fluticasone (FLONASE) 50 MCG/ACT nasal spray 2 sprays by Nasal route daily.        . metoprolol (LOPRESSOR) 50 MG tablet Take 50 mg by mouth 2 (two) times daily.        . multivitamin (THERAGRAN) per tablet Take 1 tablet by mouth daily.        Marland Kitchen olmesartan (BENICAR) 20 MG tablet Take 20 mg by mouth daily.          Current Medications (verified) Current Outpatient Prescriptions  Medication Sig Dispense Refill  . amlodipine-atorvastatin (CADUET) 10-20 MG per tablet Take 1 tablet by mouth daily.        Marland Kitchen aspirin 325 MG tablet Take 325 mg by mouth daily.        . fish oil-omega-3 fatty acids 1000 MG capsule Take 2 g by mouth daily.        . fluticasone (FLONASE) 50 MCG/ACT nasal spray 2 sprays by Nasal route daily.        . metoprolol (LOPRESSOR) 50 MG tablet Take 50 mg by mouth 2 (two)  times daily.        . multivitamin (THERAGRAN) per tablet Take 1 tablet by mouth daily.        Marland Kitchen olmesartan (BENICAR) 20 MG tablet Take 20 mg by mouth daily.           Allergies (verified) Review of patient's allergies indicates no known allergies.   PAST HISTORY  Family History Family History  Problem Relation Age of Onset  . Coronary artery disease    . Heart disease Mother   . Heart disease Father   . Hyperlipidemia Father   . Hypertension Father     Social History History  Substance Use Topics  . Smoking status: Never Smoker   . Smokeless tobacco: Not on file  . Alcohol Use: Yes    Are there smokers in your home (other than you)?  No  Risk Factors Current exercise habits: The patient does not participate in regular exercise at present.  Dietary issues discussed: weight gain of 25 lbs   Cardiac risk factors: advanced age (older than 58  for men, 81 for women), hypertension, male gender, obesity (BMI >= 30 kg/m2) and sedentary lifestyle.  Depression Screen (Note: if answer to either of the following is "Yes", a more complete depression screening is indicated)   Q1: Over the past two weeks, have you felt down, depressed or hopeless? No  Q2: Over the past two weeks, have you felt little interest or pleasure in doing things? No  Have you lost interest or pleasure in daily life? No  Do you often feel hopeless? No  Do you cry easily over simple problems? No  Activities of Daily Living In your present state of health, do you have any difficulty performing the following activities?:  Driving? No Managing money?  No Feeding yourself? No Getting from bed to chair? No Climbing a flight of stairs? No Preparing food and eating?: no Bathing or showering? No Getting dressed: No Getting to the toilet? No Using the toilet:No Moving around from place to place: No In the past year have you fallen or had a near fall?:No   Are you sexually active?  Yes  Do you have more than one partner?  No  Hearing Difficulties: No Do you often ask people to speak up or repeat themselves? No Do you experience ringing or noises in your ears? No Do you have difficulty understanding soft or whispered voices? No   Do you feel that you have a problem with memory? No  Do you often misplace items? No  Do you feel safe at home?  No  Cognitive Testing  Alert? Yes  Normal Appearance?Yes  Oriented to person? Yes  Place? Yes   Time? Yes  Recall of three objects?  Yes  Can perform simple calculations? Yes  Displays appropriate judgment?Yes  Can read the correct time from a watch face?Yes   Advanced Directives have been discussed with the patient? Yes   List the Names of Other Physician/Practitioners you currently use: 1.  [facility] 2.  Card: none at present  Indicate any recent Medical Services you may have received from other than  [facility] providers in the past year (date may be approximate).  Immunization History  Administered Date(s) Administered  . Influenza Whole 06/26/2007, 05/20/2008, 07/15/2009  . Pneumococcal Polysaccharide 08/30/2001, 07/15/2009  . Td 08/30/2005    Screening Tests Health Maintenance  Topic Date Due  . Zostavax  03/07/1997  . Influenza Vaccine  05/31/2011  . Colonoscopy  12/30/2013  .  Tetanus/tdap  08/31/2015  . Pneumococcal Polysaccharide Vaccine Age 75 And Over  Completed    All answers were reviewed with the patient and necessary referrals were made:  Carrie Mew   01/06/2011   History reviewed: allergies, current medications, past family history, past medical history, past social history, past surgical history and problem list  Review of Systems A comprehensive review of systems was negative except for: Constitutional: positive for weight gain Musculoskeletal: positive for stiff joints and left knee pain    Objective:     Vision by Snellen chart: right eye:20/20, left eye:20/20 Blood pressure 152/80, pulse 76, temperature 98.2 F (36.8 C), temperature source Oral, resp. rate 16, height 6' (1.829 m), weight 244 lb (110.678 kg). Body mass index is 33.09 kg/(m^2).  BP 152/80  Pulse 76  Temp(Src) 98.2 F (36.8 C) (Oral)  Resp 16  Ht 6' (1.829 m)  Wt 244 lb (110.678 kg)  BMI 33.09 kg/m2 General appearance: alert, cooperative and appears stated age Head: Normocephalic, without obvious abnormality, atraumatic Eyes: conjunctivae/corneas clear. PERRL, EOM's intact. Fundi benign. Neck: no adenopathy, no carotid bruit, no JVD, supple, symmetrical, trachea midline and thyroid not enlarged, symmetric, no tenderness/mass/nodules Lungs: clear to auscultation bilaterally Chest wall: no tenderness Heart: regular rate and rhythm Abdomen: soft, non-tender; bowel sounds normal; no masses,  no organomegaly Rectal: the prostate is enlarged at the bilateral, with an approx volume of 50  gms, 2+ bulge Extremities: extremities normal, atraumatic, no cyanosis or edema     Assessment:      Patient presents for yearly preventative medicine examination.   all immunizations and health maintenance protocols were reviewed with the patient and they are up to date with these protocols.   screening laboratory values were reviewed with the patient including screening of hyperlipidemia PSA renal function and hepatic function.   There medications past medical history social history problem list and allergies were reviewed in detail.   Goals were established with regard to weight loss exercise diet in compliance with medications      Plan:     During the course of the visit the patient was educated and counseled about appropriate screening and preventive services including:    Nutrition counseling   Advanced directives: power of attorney for healthcare on file  Diet review for nutrition referral? Yes __x__  Not Indicated ____   Patient Instructions (the written plan) was given to the patient.  Medicare Attestation I have personally reviewed: The patient's medical and social history Their use of alcohol, tobacco or illicit drugs Their current medications and supplements The patient's functional ability including ADLs,fall risks, home safety risks, cognitive, and hearing and visual impairment Diet and physical activities Evidence for depression or mood disorders  The patient's weight, height, BMI, and visual acuity have been recorded in the chart.  I have made referrals, counseling, and provided education to the patient based on review of the above and I have provided the patient with a written personalized care plan for preventive services.     Darryll Capers EDWARD   01/06/2011

## 2011-01-07 LAB — PSA: PSA: 4.09 ng/mL — ABNORMAL HIGH (ref 0.10–4.00)

## 2011-01-08 ENCOUNTER — Other Ambulatory Visit: Payer: Self-pay | Admitting: Internal Medicine

## 2011-01-15 NOTE — Op Note (Signed)
[place]  Patient:    JAHSIAH, CARPENTER Visit Number: [id number] MRN: [id number]          Service Type: DSU Location: [facility] Attending Physician:  Bonnetta Barry Dictated by:   Velora Heckler, M.D. Proc. Date: 07/13/01 Admit Date:  07/13/2001   CC:         Stacie Glaze, M.D. [facility]   Operative Report  PREOPERATIVE DIAGNOSIS:  Soft tissue mass, right elbow.  POSTOPERATIVE DIAGNOSIS:  Soft tissue mass, right elbow.  PROCEDURE:  Excision, soft tissue mass right elbow (2.5 x 2.0 x 1.5 cm).  SURGEON:  Velora Heckler, M.D.  ANESTHESIA:  Local with intravenous sedation.  PREPARATION:  Betadine.  COMPLICATIONS:  None.  ESTIMATED BLOOD LOSS:  Minimal.  INDICATION:  The patient is a 77 year old white male seen at the request of Dr. Darryll Capers with soft tissue mass on the right elbow.  This has been present for four to five years.  However, it has recently begun increasing in size and is causing him mild to moderate discomfort.  It has minimal effect on range of motion of the right upper extremity.  The patient now comes to surgery for excision.  DESCRIPTION OF PROCEDURE:  The procedure was done in OR #4 at the [facility] day surgery center.  The patient is brought to the operating room, placed in a supine position on the operating room table with the right arm extended on a table.  The patient is sedated with intravenous sedation.  Right arm is prepped and draped in the usual strict aseptic fashion.  After ascertaining that an adequate level of sedation had been obtained, the skin is anesthetized with local anesthetic.  This is infiltrated into the deep tissue planes.  A 3 cm incision is made over the mass.  Dissection is carried into the subcutaneous tissues and hemostasis obtained with the electrocautery.  The mass appears to be a combination of solid and vascular in nature.  It is excised using the electrocautery for hemostasis  circumferentially.  There appears to be a feeding vessel coming from distal in the arm.  This is suture ligated with a 4-0 Vicryl suture ligature.  The mass is excised completely and submitted to pathology for review.  Good hemostasis is noted.  Skin edges are reapproximated with interrupted 4-0 Vicryl subcuticular sutures.  The wound is washed and dried and benzoin and Steri-Strips are applied.  Sterile gauze dressing, followed by a Kerlix wrap is applied to the right extremity.  The patient is awakened from anesthesia and brought to the recovery room in stable condition.  The patient tolerated the procedure well. Dictated by:   Velora Heckler, M.D. Attending Physician:  Bonnetta Barry DD:  07/13/01 TD:  07/13/01 Job: [id number]

## 2011-04-08 ENCOUNTER — Encounter: Payer: Self-pay | Admitting: Internal Medicine

## 2011-04-08 ENCOUNTER — Other Ambulatory Visit: Payer: Self-pay | Admitting: *Deleted

## 2011-04-08 ENCOUNTER — Ambulatory Visit (INDEPENDENT_AMBULATORY_CARE_PROVIDER_SITE_OTHER): Payer: Medicare Other | Admitting: Internal Medicine

## 2011-04-08 VITALS — BP 130/84 | HR 60 | Temp 98.2°F | Resp 16 | Ht 72.0 in | Wt 236.0 lb

## 2011-04-08 DIAGNOSIS — I10 Essential (primary) hypertension: Secondary | ICD-10-CM

## 2011-04-08 DIAGNOSIS — D649 Anemia, unspecified: Secondary | ICD-10-CM

## 2011-04-08 DIAGNOSIS — K644 Residual hemorrhoidal skin tags: Secondary | ICD-10-CM | POA: Insufficient documentation

## 2011-04-08 DIAGNOSIS — E785 Hyperlipidemia, unspecified: Secondary | ICD-10-CM

## 2011-04-08 DIAGNOSIS — K911 Postgastric surgery syndromes: Secondary | ICD-10-CM

## 2011-04-08 DIAGNOSIS — C189 Malignant neoplasm of colon, unspecified: Secondary | ICD-10-CM

## 2011-04-08 LAB — BASIC METABOLIC PANEL
BUN: 20 mg/dL (ref 6–23)
CO2: 29 mEq/L (ref 19–32)
Chloride: 103 mEq/L (ref 96–112)
GFR: 70.27 mL/min (ref >60.00)
Glucose, Bld: 77 mg/dL (ref 70–99)
Potassium: 4 mEq/L (ref 3.5–5.1)
Sodium: 139 mEq/L (ref 135–145)

## 2011-04-08 MED ORDER — ANALPRAM ADVANCED 2.5-1 % CO KIT: 1.0000 "application " | PACK | Freq: Two times a day (BID) | Status: DC | PRN

## 2011-04-08 MED ORDER — OLMESARTAN MEDOXOMIL 40 MG PO TABS: 40.0000 mg | ORAL_TABLET | Freq: Every day | ORAL | Status: DC

## 2011-04-08 MED ORDER — METOPROLOL TARTRATE 50 MG PO TABS: 50.0000 mg | ORAL_TABLET | Freq: Two times a day (BID) | ORAL | Status: DC

## 2011-04-08 MED ORDER — AMLODIPINE-ATORVASTATIN 10-20 MG PO TABS: 1.0000 | ORAL_TABLET | Freq: Every day | ORAL | Status: DC

## 2011-04-08 NOTE — Progress Notes (Signed)
Addended by: Stacie Glaze MD E on: 04/08/2011 10:44 AM   Modules accepted: Orders

## 2011-04-08 NOTE — Assessment & Plan Note (Addendum)
Post colectomy He is to return to gastroenterology for anastomosis reviewed as well as discussion of banding for his hemorrhoidal tissue versus injection sclerotherapy

## 2011-04-08 NOTE — Patient Instructions (Signed)
To back to the gastroenterologist for probable short sigmoidoscopy that will allow them to look at the anastomosis to make sure that it's clear at the same time he can discuss hemorrhoidal treatments which may include banding or injection sclerotherapy

## 2011-04-08 NOTE — Progress Notes (Signed)
Addended by: Willy Eddy on: 04/08/2011 10:51 AM   Modules accepted: Orders

## 2011-04-08 NOTE — Assessment & Plan Note (Signed)
Adding metamucil helps The loose stools have improved But the hemorrhoid have worsened

## 2011-04-08 NOTE — Progress Notes (Signed)
  Subjective:    Patient ID: Jermaine Castaneda, male    DOB: 11-Aug-1935, 76 y.o.   MRN: [id number]  HPI Patient is a 76 year old white male who presents for followup of hypothyroidism and hypertension.  He has lost weight as planned.  His blood pressure today is stable we will monitor basic metabolic panel and a thyroid panel today he also has a chief complaint of worsened hemorrhoidal tissue in the past this has been treated by the general surgeon who did his colectomy for colon cancer but at this time he is no longer seeing the surgeon.  He also has questions about the need to return to gastroenterology for monitoring we discussed the rationale for monitoring the anastomosis   Review of Systems  Constitutional: Negative for fever and fatigue.  HENT: Negative for hearing loss, congestion, neck pain and postnasal drip.   Eyes: Negative for discharge, redness and visual disturbance.  Respiratory: Negative for cough, shortness of breath and wheezing.   Cardiovascular: Negative for leg swelling.  Gastrointestinal: Negative for abdominal pain, constipation and abdominal distention.  Genitourinary: Negative for urgency and frequency.  Musculoskeletal: Negative for joint swelling and arthralgias.  Skin: Negative for color change and rash.  Neurological: Negative for weakness and light-headedness.  Hematological: Negative for adenopathy.  Psychiatric/Behavioral: Negative for behavioral problems.   Past Medical History  Diagnosis Date  . CAD (coronary artery disease)   . Hyperlipidemia   . Hypertension    Past Surgical History  Procedure Date  . Coronary artery bypass graft   . Coronary artery bypass graft 1996  . Total knee arthroplasty 2003    reports that he has never smoked. He does not have any smokeless tobacco history on file. He reports that he drinks alcohol. He reports that he does not use illicit drugs. family history includes Coronary artery disease in an unspecified family  member; Heart disease in his father and mother; Hyperlipidemia in his father; and Hypertension in his father. No Known Allergies     Objective:   Physical Exam  Vitals reviewed. Constitutional: He appears well-developed and well-nourished.  HENT:  Head: Normocephalic and atraumatic.  Eyes: Conjunctivae are normal. Pupils are equal, round, and reactive to light.  Neck: Normal range of motion. Neck supple.  Cardiovascular: Normal rate and regular rhythm.   Pulmonary/Chest: Effort normal and breath sounds normal.  Abdominal: Soft. Bowel sounds are normal.          Assessment & Plan:  The pt has a chronic complaint of hemorrhoids We discussed the referral to the gastroenterologist For both observation of the anastomosis and consideration for treatment options for her hemorrhoidal tissue.  Blood pressure and thyroid appear to be stable proper monitoring today continued reaching the ultimate goal 5 additional pounds of weight loss.

## 2011-06-14 ENCOUNTER — Ambulatory Visit (INDEPENDENT_AMBULATORY_CARE_PROVIDER_SITE_OTHER): Payer: Medicare Other | Admitting: Internal Medicine

## 2011-06-14 ENCOUNTER — Encounter: Payer: Self-pay | Admitting: Internal Medicine

## 2011-06-14 VITALS — BP 150/90 | HR 76 | Temp 98.2°F | Resp 16 | Ht 72.0 in | Wt 244.0 lb

## 2011-06-14 DIAGNOSIS — F1020 Alcohol dependence, uncomplicated: Secondary | ICD-10-CM

## 2011-06-14 MED ORDER — SERTRALINE HCL 25 MG PO TABS: 25.0000 mg | ORAL_TABLET | Freq: Every day | ORAL | Status: DC

## 2011-06-14 MED ORDER — LORAZEPAM 0.5 MG PO TABS: 0.5000 mg | ORAL_TABLET | ORAL | Status: DC

## 2011-06-14 NOTE — Assessment & Plan Note (Addendum)
Detox protocol with ativan Add antidepressant  zoloft ( fatty liver) Stop and recommend AA

## 2011-06-14 NOTE — Patient Instructions (Signed)
We  Will begin a detox protocol with Ativan in which she will take Ativan 4 times a day for 3 days then 3 times a day for 3 days then twice a day for 3 days then once a day for 3 days.  At the same time we will begin 25 mg of Zoloft or sertraline to help with some of the depression and anxiety and need to drink.  I do recommend that you consider attending some AA meetings.

## 2011-07-08 ENCOUNTER — Ambulatory Visit (INDEPENDENT_AMBULATORY_CARE_PROVIDER_SITE_OTHER): Payer: Medicare Other | Admitting: Internal Medicine

## 2011-07-08 ENCOUNTER — Encounter: Payer: Self-pay | Admitting: Internal Medicine

## 2011-07-08 VITALS — BP 140/80 | HR 60 | Temp 98.4°F | Resp 16 | Ht 72.0 in | Wt 232.0 lb

## 2011-07-08 DIAGNOSIS — I2510 Atherosclerotic heart disease of native coronary artery without angina pectoris: Secondary | ICD-10-CM

## 2011-07-08 DIAGNOSIS — I10 Essential (primary) hypertension: Secondary | ICD-10-CM

## 2011-07-08 DIAGNOSIS — F1020 Alcohol dependence, uncomplicated: Secondary | ICD-10-CM

## 2011-07-08 DIAGNOSIS — Z23 Encounter for immunization: Secondary | ICD-10-CM

## 2011-07-08 DIAGNOSIS — E785 Hyperlipidemia, unspecified: Secondary | ICD-10-CM

## 2011-07-08 MED ORDER — SERTRALINE HCL 25 MG PO TABS: 25.0000 mg | ORAL_TABLET | Freq: Every day | ORAL | Status: DC

## 2011-07-08 NOTE — Patient Instructions (Signed)
The patient is instructed to continue all medications as prescribed. Schedule followup with check out clerk upon leaving the clinic  

## 2011-07-08 NOTE — Progress Notes (Signed)
  Subjective:    Patient ID: Jermaine Castaneda, male    DOB: Sep 16, 1934, 76 y.o.   MRN: [id number]  HPI  Patient is a 76 year old white male followed for hypertension hyperlipidemia with a history of coronary artery disease who presented a month ago with concerned that he had lost control of alcohol and had become an alcoholic.  We decided to enter a Ativan detoxification program and then recommended that he start AA he was able to detox with the Ativan and is now stopped that medication and he is beginning his alcohol anonymous program this week.  He has selected a sponsor he has discussed the need for continuing monitoring with a sponsor and we discussed daily vigilance at this point.  4 a day by day type program.  His blood pressure is stable he has no signs of intoxication nor any signs of withdrawal at this time  Review of Systems  Constitutional: Negative for fever and fatigue.  HENT: Negative for hearing loss, congestion, neck pain and postnasal drip.   Eyes: Negative for discharge, redness and visual disturbance.  Respiratory: Negative for cough, shortness of breath and wheezing.   Cardiovascular: Negative for leg swelling.  Gastrointestinal: Negative for abdominal pain, constipation and abdominal distention.  Genitourinary: Negative for urgency and frequency.  Musculoskeletal: Negative for joint swelling and arthralgias.  Skin: Negative for color change and rash.  Neurological: Negative for weakness and light-headedness.  Hematological: Negative for adenopathy.  Psychiatric/Behavioral: Negative for behavioral problems.   Past Medical History  Diagnosis Date  . CAD (coronary artery disease)   . Hyperlipidemia   . Hypertension    Past Surgical History  Procedure Date  . Coronary artery bypass graft   . Coronary artery bypass graft 1996  . Total knee arthroplasty 2003    reports that he has never smoked. He does not have any smokeless tobacco history on file. He reports that he  drinks alcohol. He reports that he does not use illicit drugs. family history includes Coronary artery disease in an unspecified family member; Heart disease in his father and mother; Hyperlipidemia in his father; and Hypertension in his father. No Known Allergies     Objective:   Physical Exam  Nursing note and vitals reviewed. Constitutional: He appears well-developed and well-nourished.  HENT:  Head: Normocephalic and atraumatic.  Eyes: Conjunctivae are normal. Pupils are equal, round, and reactive to light.  Neck: Normal range of motion. Neck supple.  Cardiovascular: Normal rate and regular rhythm.   Pulmonary/Chest: Effort normal and breath sounds normal.  Abdominal: Soft. Bowel sounds are normal.          Assessment & Plan:  Continuing monitoring of alcohol withdrawal I believe that clinically he has been able to completely withdrawal from alcohol and no longer needs the Ativan we have recommended that he continue the Zoloft and continue his approach to Alcoholics Anonymous in the 12-step program.  Blood pressure is stable coronary artery disease without any symptoms of recurrence no chest pain no shortness of breath no PND orthopnea

## 2011-09-10 ENCOUNTER — Ambulatory Visit (INDEPENDENT_AMBULATORY_CARE_PROVIDER_SITE_OTHER): Payer: Medicare Other | Admitting: Internal Medicine

## 2011-09-10 ENCOUNTER — Encounter: Payer: Self-pay | Admitting: Internal Medicine

## 2011-09-10 VITALS — BP 130/80 | HR 76 | Temp 98.3°F | Resp 16 | Ht 72.5 in | Wt 234.0 lb

## 2011-09-10 DIAGNOSIS — F1020 Alcohol dependence, uncomplicated: Secondary | ICD-10-CM

## 2011-09-10 DIAGNOSIS — I10 Essential (primary) hypertension: Secondary | ICD-10-CM

## 2011-09-10 DIAGNOSIS — E785 Hyperlipidemia, unspecified: Secondary | ICD-10-CM

## 2011-09-10 DIAGNOSIS — D649 Anemia, unspecified: Secondary | ICD-10-CM

## 2011-09-10 LAB — HEPATIC FUNCTION PANEL
Alkaline Phosphatase: 64 U/L (ref 39–117)
Bilirubin, Direct: 0 mg/dL (ref 0.0–0.3)

## 2011-09-10 LAB — BASIC METABOLIC PANEL
Calcium: 9.2 mg/dL (ref 8.4–10.5)
Creatinine, Ser: 1.1 mg/dL (ref 0.4–1.5)
GFR: 71.7 mL/min (ref >60.00)
Sodium: 140 mEq/L (ref 135–145)

## 2011-09-10 NOTE — Progress Notes (Signed)
Subjective:    Patient ID: Jermaine Castaneda, male    DOB: Jun 20, 1935, 77 y.o.   MRN: [id number]  HPI No going to meeting Has not resumed ETOH Has support form his church The Zoloft has helped Has reached the 90 day mark Blood pressure is stable current medications he is treated hypothyroidism and hyperlipidemia he has significant risk factors for coronary artery disease including a past history    Review of Systems  Constitutional: Negative for fever and fatigue.  HENT: Negative for hearing loss, congestion, neck pain and postnasal drip.   Eyes: Negative for discharge, redness and visual disturbance.  Respiratory: Negative for cough, shortness of breath and wheezing.   Cardiovascular: Negative for leg swelling.  Gastrointestinal: Negative for abdominal pain, constipation and abdominal distention.  Genitourinary: Negative for urgency and frequency.  Musculoskeletal: Negative for joint swelling and arthralgias.  Skin: Negative for color change and rash.  Neurological: Negative for weakness and light-headedness.  Hematological: Negative for adenopathy.  Psychiatric/Behavioral: Negative for behavioral problems.   Past Medical History  Diagnosis Date  . CAD (coronary artery disease)   . Hyperlipidemia   . Hypertension     History   Social History  . Marital Status: Married    Spouse Name: N/A    Number of Children: N/A  . Years of Education: N/A   Occupational History  . Not on file.   Social History Main Topics  . Smoking status: Never Smoker   . Smokeless tobacco: Not on file  . Alcohol Use: Yes  . Drug Use: No  . Sexually Active: Yes   Other Topics Concern  . Not on file   Social History Narrative  . No narrative on file    Past Surgical History  Procedure Date  . Coronary artery bypass graft   . Coronary artery bypass graft 1996  . Total knee arthroplasty 2003    Family History  Problem Relation Age of Onset  . Coronary artery disease    . Heart disease  Mother   . Heart disease Father   . Hyperlipidemia Father   . Hypertension Father     No Known Allergies  Current Outpatient Prescriptions on File Prior to Visit  Medication Sig Dispense Refill  . amlodipine-atorvastatin (CADUET) 10-20 MG per tablet Take 1 tablet by mouth daily.  90 tablet  3  . aspirin 325 MG tablet Take 325 mg by mouth daily.        . fish oil-omega-3 fatty acids 1000 MG capsule Take 2 g by mouth daily.        . fluticasone (FLONASE) 50 MCG/ACT nasal spray 2 sprays by Nasal route daily.        Marland Kitchen HC-Pramox-Diet Mng Prod -Wipe (ANALPRAM ADVANCED) 2.5-1 % KIT 1 application by Combination route 2 (two) times daily as needed.  1 kit  11  . metoprolol (LOPRESSOR) 50 MG tablet Take 1 tablet (50 mg total) by mouth 2 (two) times daily.  180 tablet  3  . multivitamin (THERAGRAN) per tablet Take 1 tablet by mouth daily.        Marland Kitchen olmesartan (BENICAR) 40 MG tablet Take 1 tablet (40 mg total) by mouth daily.  90 tablet  3  . sertraline (ZOLOFT) 25 MG tablet Take 1 tablet (25 mg total) by mouth daily.  30 tablet  5    BP 130/80  Pulse 76  Temp 98.3 F (36.8 C)  Resp 16  Ht 6' 0.5" (1.842 m)  Wt 234 lb (  106.142 kg)  BMI 31.30 kg/m2       Objective:   Physical Exam  Nursing note and vitals reviewed. Constitutional: He appears well-developed and well-nourished.  HENT:  Head: Normocephalic and atraumatic.  Eyes: Conjunctivae are normal. Pupils are equal, round, and reactive to light.  Neck: Normal range of motion. Neck supple.  Cardiovascular: Normal rate and regular rhythm.   Pulmonary/Chest: Effort normal and breath sounds normal.  Abdominal: Soft. Bowel sounds are normal.          Assessment & Plan:  Lengthy discussion with this patient about his history of alcohol abuse and how important it is to remain in a program such as AA to prevent relapse.  We'll monitor alcohol level today as a screening he has a history of hypertension hyperlipidemia and  hypothyroidism with also a history of coronary artery disease we emphasized the risk of relapse in light of his diagnoses.  His blood pressure is stable his thyroid is stable he'll present for physical monitoring of appropriate lab work at the next visit

## 2011-09-10 NOTE — Patient Instructions (Signed)
The patient is instructed to continue all medications as prescribed. Schedule followup with check out clerk upon leaving the clinic  

## 2011-09-11 LAB — ETHANOL: Alcohol, Ethyl (B): <10 mg/dL (ref 0–10)

## 2011-10-29 ENCOUNTER — Telehealth: Payer: Self-pay | Admitting: *Deleted

## 2011-10-29 MED ORDER — AZITHROMYCIN 250 MG PO TABS: ORAL_TABLET | ORAL | Status: AC

## 2011-10-29 NOTE — Telephone Encounter (Signed)
Pt calls stating he takes care of his grandaughter, and she has pertussis.  Her doctor recommends Pt get an antibiotic for this exposure.

## 2011-10-29 NOTE — Telephone Encounter (Signed)
Pt called back- he      Started taking z pack but refused to come here or go to urgent care for tdap because he states he has no transportation-pt encouraged to get as soon as possible-dr jenkins informed

## 2011-10-29 NOTE — Telephone Encounter (Signed)
Per dr swords- give a z pack and give tdap-pt last tetanus was in 2007 and not a tdap- med sent in ,but pt cannot come here- will go to urgent care for tdap

## 2011-12-09 ENCOUNTER — Ambulatory Visit (INDEPENDENT_AMBULATORY_CARE_PROVIDER_SITE_OTHER): Payer: Medicare Other | Admitting: Internal Medicine

## 2011-12-09 ENCOUNTER — Encounter: Payer: Self-pay | Admitting: Internal Medicine

## 2011-12-09 VITALS — BP 136/80 | HR 72 | Temp 98.2°F | Resp 16 | Ht 72.0 in | Wt 238.0 lb

## 2011-12-09 DIAGNOSIS — E039 Hypothyroidism, unspecified: Secondary | ICD-10-CM

## 2011-12-09 DIAGNOSIS — N401 Enlarged prostate with lower urinary tract symptoms: Secondary | ICD-10-CM

## 2011-12-09 DIAGNOSIS — E785 Hyperlipidemia, unspecified: Secondary | ICD-10-CM

## 2011-12-09 DIAGNOSIS — Z23 Encounter for immunization: Secondary | ICD-10-CM

## 2011-12-09 DIAGNOSIS — D649 Anemia, unspecified: Secondary | ICD-10-CM

## 2011-12-09 DIAGNOSIS — T887XXA Unspecified adverse effect of drug or medicament, initial encounter: Secondary | ICD-10-CM

## 2011-12-09 DIAGNOSIS — N138 Other obstructive and reflux uropathy: Secondary | ICD-10-CM

## 2011-12-09 DIAGNOSIS — I10 Essential (primary) hypertension: Secondary | ICD-10-CM

## 2011-12-09 DIAGNOSIS — Z0000 Encounter for general adult medical examination without abnormal findings: Secondary | ICD-10-CM

## 2011-12-09 LAB — POCT URINALYSIS DIPSTICK
Bilirubin, UA: NEGATIVE
Glucose, UA: NEGATIVE
Ketones, UA: NEGATIVE
Leukocytes, UA: NEGATIVE
Protein, UA: NEGATIVE
Spec Grav, UA: 1.02

## 2011-12-09 LAB — CBC WITH DIFFERENTIAL/PLATELET
Basophils Absolute: 0 10*3/uL (ref 0.0–0.1)
Basophils Relative: 0.4 % (ref 0.0–3.0)
HCT: 42.5 % (ref 39.0–52.0)
Hemoglobin: 14.2 g/dL (ref 13.0–17.0)
Lymphocytes Relative: 40.4 % (ref 12.0–46.0)
Lymphs Abs: 4.3 10*3/uL — ABNORMAL HIGH (ref 0.7–4.0)
Monocytes Relative: 8.4 % (ref 3.0–12.0)
Neutro Abs: 5.1 10*3/uL (ref 1.4–7.7)
RBC: 4.83 Mil/uL (ref 4.22–5.81)
RDW: 14.4 % (ref 11.5–14.6)

## 2011-12-09 LAB — BASIC METABOLIC PANEL
BUN: 21 mg/dL (ref 6–23)
CO2: 26 mEq/L (ref 19–32)
Chloride: 102 mEq/L (ref 96–112)
Creatinine, Ser: 1.1 mg/dL (ref 0.4–1.5)
Potassium: 4.7 mEq/L (ref 3.5–5.1)

## 2011-12-09 LAB — HEPATIC FUNCTION PANEL
ALT: 21 U/L (ref 0–53)
AST: 18 U/L (ref 0–37)
Bilirubin, Direct: 0.1 mg/dL (ref 0.0–0.3)
Total Protein: 6.7 g/dL (ref 6.0–8.3)

## 2011-12-09 LAB — LIPID PANEL
Cholesterol: 181 mg/dL (ref 0–200)
Total CHOL/HDL Ratio: 5
Triglycerides: 185 mg/dL — ABNORMAL HIGH (ref 0.0–149.0)

## 2011-12-09 NOTE — Patient Instructions (Signed)
The patient is instructed to continue all medications as prescribed. Schedule followup with check out clerk upon leaving the clinic  

## 2011-12-09 NOTE — Progress Notes (Signed)
Subjective:    Patient ID: Jermaine Castaneda, male    DOB: 09/23/1934, 77 y.o.   MRN: [id number]  HPI Pt is a 77 year old male with a history of hyperlipidemia, htn and cad who is 6 months sober. We discussed his etoh use and he is aware of the significant risk he has for recidivism. He continues to be active in his church program. He reports no chest pain and notes hat he has gained "a few pounds" due to inactivity. We discussed the barriers to activity including his degenerative knee dz. He is compliant with his medications.     Review of Systems  Constitutional: Positive for activity change, appetite change and unexpected weight change. Negative for fever and fatigue.  HENT: Negative for hearing loss, congestion, neck pain and postnasal drip.   Eyes: Negative for discharge, redness and visual disturbance.  Respiratory: Negative for cough, shortness of breath and wheezing.   Cardiovascular: Negative for leg swelling.  Gastrointestinal: Negative for abdominal pain, constipation and abdominal distention.  Genitourinary: Negative for urgency and frequency.  Musculoskeletal: Positive for myalgias and joint swelling. Negative for arthralgias.  Skin: Negative for color change and rash.  Neurological: Negative for weakness and light-headedness.  Hematological: Negative for adenopathy.  Psychiatric/Behavioral: Negative for behavioral problems.   Past Medical History  Diagnosis Date  . CAD (coronary artery disease)   . Hyperlipidemia   . Hypertension     History   Social History  . Marital Status: Married    Spouse Name: N/A    Number of Children: N/A  . Years of Education: N/A   Occupational History  . Not on file.   Social History Main Topics  . Smoking status: Never Smoker   . Smokeless tobacco: Not on file  . Alcohol Use: Yes  . Drug Use: No  . Sexually Active: Yes   Other Topics Concern  . Not on file   Social History Narrative  . No narrative on file    Past Surgical  History  Procedure Date  . Coronary artery bypass graft   . Coronary artery bypass graft 1996  . Total knee arthroplasty 2003    Family History  Problem Relation Age of Onset  . Coronary artery disease    . Heart disease Mother   . Heart disease Father   . Hyperlipidemia Father   . Hypertension Father     No Known Allergies  Current Outpatient Prescriptions on File Prior to Visit  Medication Sig Dispense Refill  . amlodipine-atorvastatin (CADUET) 10-20 MG per tablet Take 1 tablet by mouth daily.  90 tablet  3  . aspirin 325 MG tablet Take 325 mg by mouth daily.        . fish oil-omega-3 fatty acids 1000 MG capsule Take 2 g by mouth daily.        . fluticasone (FLONASE) 50 MCG/ACT nasal spray Place 2 sprays into the nose daily as needed.       . metoprolol (LOPRESSOR) 50 MG tablet Take 1 tablet (50 mg total) by mouth 2 (two) times daily.  180 tablet  3  . multivitamin (THERAGRAN) per tablet Take 1 tablet by mouth daily.        Marland Kitchen olmesartan (BENICAR) 40 MG tablet Take 1 tablet (40 mg total) by mouth daily.  90 tablet  3  . sertraline (ZOLOFT) 25 MG tablet Take 1 tablet (25 mg total) by mouth daily.  30 tablet  5    BP 136/80  Pulse 72  Temp 98.2 F (36.8 C)  Resp 16  Ht 6' (1.829 m)  Wt 238 lb (107.956 kg)  BMI 32.28 kg/m2       Objective:   Physical Exam  Nursing note and vitals reviewed. Constitutional: He appears well-developed and well-nourished.  HENT:  Head: Normocephalic and atraumatic.  Eyes: Conjunctivae are normal. Pupils are equal, round, and reactive to light.  Neck: Normal range of motion. Neck supple.  Cardiovascular: Normal rate and regular rhythm.   Pulmonary/Chest: Effort normal and breath sounds normal.  Abdominal: Soft. Bowel sounds are normal.          Assessment & Plan:   Stable blood pressure and lipids Discussed ETOH risk  I have spent more than 30 minutes examining this patient face-to-face of which over half was spent in  counseling Set up follow up medicare wellness Check bmet for htn and renal function. Check lipids and tsh

## 2011-12-30 ENCOUNTER — Other Ambulatory Visit: Payer: Self-pay | Admitting: Internal Medicine

## 2012-03-09 ENCOUNTER — Ambulatory Visit (INDEPENDENT_AMBULATORY_CARE_PROVIDER_SITE_OTHER): Payer: Medicare Other | Admitting: Internal Medicine

## 2012-03-09 ENCOUNTER — Encounter: Payer: Self-pay | Admitting: Internal Medicine

## 2012-03-09 VITALS — BP 124/72 | HR 64 | Temp 98.2°F | Resp 16 | Ht 72.0 in | Wt 242.0 lb

## 2012-03-09 DIAGNOSIS — Z0000 Encounter for general adult medical examination without abnormal findings: Secondary | ICD-10-CM

## 2012-03-09 DIAGNOSIS — I2510 Atherosclerotic heart disease of native coronary artery without angina pectoris: Secondary | ICD-10-CM

## 2012-03-09 DIAGNOSIS — E785 Hyperlipidemia, unspecified: Secondary | ICD-10-CM

## 2012-03-09 DIAGNOSIS — D649 Anemia, unspecified: Secondary | ICD-10-CM

## 2012-03-09 DIAGNOSIS — E039 Hypothyroidism, unspecified: Secondary | ICD-10-CM

## 2012-03-09 DIAGNOSIS — R972 Elevated prostate specific antigen [PSA]: Secondary | ICD-10-CM

## 2012-03-09 DIAGNOSIS — I10 Essential (primary) hypertension: Secondary | ICD-10-CM

## 2012-03-09 NOTE — Progress Notes (Signed)
Subjective:    Patient ID: Jermaine Castaneda, male    DOB: Dec 30, 1934, 77 y.o.   MRN: [id number]  HPI Patient presents for his yearly Medicare wellness review and followup for chronic problems that include hypothyroidism hyperlipidemia a history of anemia and a history of hypertension in the setting of a history of coronary artery disease.  His also been followed for an increased PSA appropriate laboratory screenings will be drawn during this visit for this problems above he denies any chest pain shortness of breath PND orthopnea he does have a history of alcohol use and we discussed continued abstinence as being the best course.   Review of Systems  Constitutional: Negative for fever and fatigue.  HENT: Negative for hearing loss, congestion, neck pain and postnasal drip.   Eyes: Negative for discharge, redness and visual disturbance.  Respiratory: Negative for cough, shortness of breath and wheezing.   Cardiovascular: Negative for leg swelling.  Gastrointestinal: Negative for abdominal pain, constipation and abdominal distention.  Genitourinary: Negative for urgency and frequency.  Musculoskeletal: Negative for joint swelling and arthralgias.  Skin: Negative for color change and rash.  Neurological: Negative for weakness and light-headedness.  Hematological: Negative for adenopathy.  Psychiatric/Behavioral: Negative for behavioral problems.   Past Medical History  Diagnosis Date  . CAD (coronary artery disease)   . Hyperlipidemia   . Hypertension     History   Social History  . Marital Status: Married    Spouse Name: N/A    Number of Children: N/A  . Years of Education: N/A   Occupational History  . Not on file.   Social History Main Topics  . Smoking status: Never Smoker   . Smokeless tobacco: Not on file  . Alcohol Use: Yes  . Drug Use: No  . Sexually Active: Yes   Other Topics Concern  . Not on file   Social History Narrative  . No narrative on file    Past  Surgical History  Procedure Date  . Coronary artery bypass graft   . Coronary artery bypass graft 1996  . Total knee arthroplasty 2003    Family History  Problem Relation Age of Onset  . Coronary artery disease    . Heart disease Mother   . Heart disease Father   . Hyperlipidemia Father   . Hypertension Father     No Known Allergies  Current Outpatient Prescriptions on File Prior to Visit  Medication Sig Dispense Refill  . amlodipine-atorvastatin (CADUET) 10-20 MG per tablet Take 1 tablet by mouth daily.  90 tablet  3  . aspirin 325 MG tablet Take 325 mg by mouth daily.        . fish oil-omega-3 fatty acids 1000 MG capsule Take 2 g by mouth daily.        . fluticasone (FLONASE) 50 MCG/ACT nasal spray Place 2 sprays into the nose daily as needed.       . metoprolol (LOPRESSOR) 50 MG tablet Take 1 tablet (50 mg total) by mouth 2 (two) times daily.  180 tablet  3  . multivitamin (THERAGRAN) per tablet Take 1 tablet by mouth daily.        Marland Kitchen olmesartan (BENICAR) 40 MG tablet Take 1 tablet (40 mg total) by mouth daily.  90 tablet  3  . sertraline (ZOLOFT) 25 MG tablet Take 1 tablet (25 mg total) by mouth daily.  30 tablet  5  . sertraline (ZOLOFT) 25 MG tablet TAKE 1 TABLET BY MOUTH EVERY DAY  30 tablet  5           Objective:   Physical Exam  Nursing note and vitals reviewed. Constitutional: He appears well-developed and well-nourished.  HENT:  Head: Normocephalic and atraumatic.  Eyes: Conjunctivae are normal. Pupils are equal, round, and reactive to light.  Neck: Normal range of motion. Neck supple.  Cardiovascular: Normal rate and regular rhythm.   Pulmonary/Chest: Effort normal and breath sounds normal.  Abdominal: Soft. Bowel sounds are normal.    Prostate is +2 enlarged      Assessment & Plan:  Monitoring of PSA in the setting of polyp and an increased PSA.  Monitoring of coronary risk factors including hypertension which is stable hyperlipidemia which will be  measured by a lipid and liver panel today and a history of hypothyroidism which will be measured by TSH and appropriate treatment and adjustment of medications based upon these findings.   Subjective:    Jermaine Castaneda is a 77 y.o. male who presents for Medicare Annual/Subsequent preventive examination.   Preventive Screening-Counseling & Management  Tobacco History  Smoking status  . Never Smoker   Smokeless tobacco  . Not on file    Problems Prior to Visit 1.   Current Problems (verified) Patient Active Problem List  Diagnosis  . MALIGNANT NEOPLASM OF COLON UNSPECIFIED SITE  . COLONIC POLYPS  . HYPOTHYROIDISM  . HYPERLIPIDEMIA  . ANEMIA  . HYPERTENSION  . CORONARY ARTERY DISEASE  . DUMPING SYNDROME  . ACTINIC KERATOSIS  . LOC OSTEOARTHROS NOT SPEC PRIM/SEC LOWER LEG  . PSA, INCREASED  . Hemorrhoids, external with complications  . Alcohol dependence    Medications Prior to Visit Current Outpatient Prescriptions on File Prior to Visit  Medication Sig Dispense Refill  . amlodipine-atorvastatin (CADUET) 10-20 MG per tablet Take 1 tablet by mouth daily.  90 tablet  3  . aspirin 325 MG tablet Take 325 mg by mouth daily.        . fish oil-omega-3 fatty acids 1000 MG capsule Take 2 g by mouth daily.        . fluticasone (FLONASE) 50 MCG/ACT nasal spray Place 2 sprays into the nose daily as needed.       . metoprolol (LOPRESSOR) 50 MG tablet Take 1 tablet (50 mg total) by mouth 2 (two) times daily.  180 tablet  3  . multivitamin (THERAGRAN) per tablet Take 1 tablet by mouth daily.        Marland Kitchen olmesartan (BENICAR) 40 MG tablet Take 1 tablet (40 mg total) by mouth daily.  90 tablet  3  . sertraline (ZOLOFT) 25 MG tablet Take 1 tablet (25 mg total) by mouth daily.  30 tablet  5  . sertraline (ZOLOFT) 25 MG tablet TAKE 1 TABLET BY MOUTH EVERY DAY  30 tablet  5    Current Medications (verified) Current Outpatient Prescriptions  Medication Sig Dispense Refill  .  amlodipine-atorvastatin (CADUET) 10-20 MG per tablet Take 1 tablet by mouth daily.  90 tablet  3  . aspirin 325 MG tablet Take 325 mg by mouth daily.        . fish oil-omega-3 fatty acids 1000 MG capsule Take 2 g by mouth daily.        . fluticasone (FLONASE) 50 MCG/ACT nasal spray Place 2 sprays into the nose daily as needed.       . metoprolol (LOPRESSOR) 50 MG tablet Take 1 tablet (50 mg total) by mouth 2 (two) times daily.  180  tablet  3  . multivitamin (THERAGRAN) per tablet Take 1 tablet by mouth daily.        Marland Kitchen olmesartan (BENICAR) 40 MG tablet Take 1 tablet (40 mg total) by mouth daily.  90 tablet  3  . sertraline (ZOLOFT) 25 MG tablet Take 1 tablet (25 mg total) by mouth daily.  30 tablet  5  . sertraline (ZOLOFT) 25 MG tablet TAKE 1 TABLET BY MOUTH EVERY DAY  30 tablet  5     Allergies (verified) Review of patient's allergies indicates no known allergies.   PAST HISTORY  Family History Family History  Problem Relation Age of Onset  . Coronary artery disease    . Heart disease Mother   . Heart disease Father   . Hyperlipidemia Father   . Hypertension Father     Social History History  Substance Use Topics  . Smoking status: Never Smoker   . Smokeless tobacco: Not on file  . Alcohol Use: Yes    Are there smokers in your home (other than you)?  No  Risk Factors Current exercise habits: The patient does not participate in regular exercise at present.  Dietary issues discussed: avoid alcohol   Cardiac risk factors: advanced age (older than 83 for men, 74 for women), dyslipidemia, hypertension, male gender and obesity (BMI >= 30 kg/m2).  Depression Screen (Note: if answer to either of the following is "Yes", a more complete depression screening is indicated)   Q1: Over the past two weeks, have you felt down, depressed or hopeless? No  Q2: Over the past two weeks, have you felt little interest or pleasure in doing things? No  Have you lost interest or pleasure in  daily life? No  Do you often feel hopeless? No  Do you cry easily over simple problems? No  Activities of Daily Living In your present state of health, do you have any difficulty performing the following activities?:  Driving? No Managing money?  No Feeding yourself? No Getting from bed to chair? No Climbing a flight of stairs? No Preparing food and eating?: No Bathing or showering? No Getting dressed: No Getting to the toilet? No Using the toilet:No Moving around from place to place: No In the past year have you fallen or had a near fall?:No   Are you sexually active?  Yes  Do you have more than one partner?  No  Hearing Difficulties: No Do you often ask people to speak up or repeat themselves? No Do you experience ringing or noises in your ears? No Do you have difficulty understanding soft or whispered voices? No   Do you feel that you have a problem with memory? No  Do you often misplace items? No  Do you feel safe at home?  No  Cognitive Testing  Alert? Yes  Normal Appearance?Yes  Oriented to person? Yes  Place? Yes   Time? Yes  Recall of three objects?  Yes  Can perform simple calculations? Yes  Displays appropriate judgment?Yes  Can read the correct time from a watch face?Yes   Advanced Directives have been discussed with the patient? Yes   List the Names of Other Physician/Practitioners you currently use: 1.    Indicate any recent Medical Services you may have received from other than [facility] providers in the past year (date may be approximate).  Immunization History  Administered Date(s) Administered  . Influenza Split 07/08/2011  . Influenza Whole 06/26/2007, 05/20/2008, 07/15/2009  . Pneumococcal Polysaccharide 08/30/2001, 07/15/2009  . Td  08/30/2005  . Tdap 12/09/2011    Screening Tests Health Maintenance  Topic Date Due  . Zostavax  03/07/1997  . Influenza Vaccine  05/30/2012  . Colonoscopy  12/30/2013  . Tetanus/tdap  12/08/2021  . Pneumococcal  Polysaccharide Vaccine Age 30 And Over  Completed    All answers were reviewed with the patient and necessary referrals were made:  Carrie Mew, MD   03/09/2012   History reviewed: allergies, current medications, past family history, past medical history, past social history, past surgical history and problem list  Review of Systems A comprehensive review of systems was negative.    Objective:     Vision by Snellen chart: right eye:20/20, left eye:20/20 corrected    There were no vitals taken for this visit.  General Appearance:    Alert, cooperative, no distress, appears stated age  Head:    Normocephalic, without obvious abnormality, atraumatic  Eyes:    PERRL, conjunctiva/corneas clear, EOM's intact, fundi    benign, both eyes       Ears:    Normal TM's and external ear canals, both ears  Nose:   Nares normal, septum midline, mucosa normal, no drainage    or sinus tenderness  Throat:   Lips, mucosa, and tongue normal; teeth and gums normal  Neck:   Supple, symmetrical, trachea midline, no adenopathy;       thyroid:  No enlargement/tenderness/nodules; no carotid   bruit or JVD  Back:     Symmetric, no curvature, ROM normal, no CVA tenderness  Lungs:     Clear to auscultation bilaterally, respirations unlabored  Chest wall:    No tenderness or deformity  Heart:    Regular rate and rhythm, S1 and S2 normal, no murmur, rub   or gallop  Abdomen:     Soft, non-tender, bowel sounds active all four quadrants,    no masses, no organomegaly  Genitalia:    Normal male without lesion, discharge or tenderness  Rectal:    Normal tone, normal prostate, no masses or tenderness;   guaiac negative stool  Extremities:   Extremities normal, atraumatic, no cyanosis or edema  Pulses:   2+ and symmetric all extremities  Skin:   Skin color, texture, turgor normal, no rashes or lesions  Lymph nodes:   Cervical, supraclavicular, and axillary nodes normal  Neurologic:   CNII-XII intact.  Normal strength, sensation and reflexes      throughout       Assessment:     Patient presents for yearly preventative medicine examination.   all immunizations and health maintenance protocols were reviewed with the patient and they are up to date with these protocols.   screening laboratory values were reviewed with the patient including screening of hyperlipidemia PSA renal function and hepatic function.   There medications past medical history social history problem list and allergies were reviewed in detail.   Goals were established with regard to weight loss exercise diet in compliance with medications      Plan:     During the course of the visit the patient was educated and counseled about appropriate screening and preventive services including:    Influenza vaccine  Td vaccine  Prostate cancer screening  Nutrition counseling   Diet review for nutrition referral? Yes ____x  Not Indicated ____   Patient Instructions (the written plan) was given to the patient.  Medicare Attestation I have personally reviewed: The patient's medical and social history Their use of alcohol, tobacco or illicit drugs  Their current medications and supplements The patient's functional ability including ADLs,fall risks, home safety risks, cognitive, and hearing and visual impairment Diet and physical activities Evidence for depression or mood disorders  The patient's weight, height, BMI, and visual acuity have been recorded in the chart.  I have made referrals, counseling, and provided education to the patient based on review of the above and I have provided the patient with a written personalized care plan for preventive services.     Carrie Mew, MD   03/09/2012      reviewed labs Significant drop in the PSA Hx of colon cancer, CEA testing at GI Stable lipids Has a CT scan planned HTN stable Thyroid stable

## 2012-03-09 NOTE — Patient Instructions (Addendum)
The patient is instructed to continue all medications as prescribed. Schedule followup with check out clerk upon leaving the clinic  

## 2012-04-03 ENCOUNTER — Other Ambulatory Visit: Payer: Self-pay | Admitting: Internal Medicine

## 2012-05-08 ENCOUNTER — Other Ambulatory Visit: Payer: Self-pay | Admitting: Internal Medicine

## 2012-05-23 NOTE — Progress Notes (Signed)
  Subjective:    Patient ID: Jermaine Castaneda, male    DOB: [birth year], 77 y.o.   MRN: [id number]  HPI  Presents intoxicated for referral for detoxification and alcohol cessation  Review of Systems     Objective:   Physical Exam        Assessment & Plan:  Recommend inpatient detoxification at [facility]

## 2012-06-21 ENCOUNTER — Other Ambulatory Visit: Payer: Self-pay | Admitting: Internal Medicine

## 2012-07-11 ENCOUNTER — Other Ambulatory Visit: Payer: Self-pay | Admitting: Internal Medicine

## 2012-07-19 ENCOUNTER — Encounter: Payer: Self-pay | Admitting: Internal Medicine

## 2012-07-20 ENCOUNTER — Encounter: Payer: Self-pay | Admitting: Internal Medicine

## 2012-09-08 ENCOUNTER — Ambulatory Visit (INDEPENDENT_AMBULATORY_CARE_PROVIDER_SITE_OTHER): Payer: Medicare Other | Admitting: Internal Medicine

## 2012-09-08 ENCOUNTER — Encounter: Payer: Self-pay | Admitting: Internal Medicine

## 2012-09-08 VITALS — BP 140/82 | HR 76 | Temp 98.4°F | Resp 16 | Ht 72.0 in | Wt 250.0 lb

## 2012-09-08 DIAGNOSIS — D649 Anemia, unspecified: Secondary | ICD-10-CM

## 2012-09-08 DIAGNOSIS — E039 Hypothyroidism, unspecified: Secondary | ICD-10-CM

## 2012-09-08 DIAGNOSIS — I10 Essential (primary) hypertension: Secondary | ICD-10-CM

## 2012-09-08 DIAGNOSIS — K911 Postgastric surgery syndromes: Secondary | ICD-10-CM

## 2012-09-08 NOTE — Patient Instructions (Signed)
The patient is instructed to continue all medications as prescribed. Schedule followup with check out clerk upon leaving the clinic  

## 2012-09-08 NOTE — Progress Notes (Signed)
Subjective:    Patient ID: Jermaine Castaneda, male    DOB: 1935-07-20, 78 y.o.   MRN: [id number]  HPI Hx of dumping syndrome that is variable with diet Has skin tags and anal irritations Had flex with GI for hemorrhoids  HTN stable Hx of anemia  Thyroid replacement  Review of Systems  Constitutional: Positive for fatigue. Negative for fever.       Obese  HENT: Negative for hearing loss, congestion, neck pain and postnasal drip.   Eyes: Negative for discharge, redness and visual disturbance.  Respiratory: Negative for cough, shortness of breath and wheezing.   Cardiovascular: Negative for leg swelling.  Gastrointestinal: Positive for diarrhea and rectal pain. Negative for abdominal pain, constipation and abdominal distention.  Genitourinary: Negative for urgency and frequency.  Musculoskeletal: Negative for joint swelling and arthralgias.  Skin: Negative for color change and rash.  Neurological: Negative for weakness and light-headedness.  Hematological: Negative for adenopathy.  Psychiatric/Behavioral: Negative for behavioral problems.   Past Medical History  Diagnosis Date  . CAD (coronary artery disease)   . Hyperlipidemia   . Hypertension     History   Social History  . Marital Status: Married    Spouse Name: N/A    Number of Children: N/A  . Years of Education: N/A   Occupational History  . Not on file.   Social History Main Topics  . Smoking status: Never Smoker   . Smokeless tobacco: Not on file  . Alcohol Use: Yes  . Drug Use: No  . Sexually Active: Yes   Other Topics Concern  . Not on file   Social History Narrative  . No narrative on file    Past Surgical History  Procedure Date  . Coronary artery bypass graft   . Coronary artery bypass graft 1996  . Total knee arthroplasty 2003    Family History  Problem Relation Age of Onset  . Coronary artery disease    . Heart disease Mother   . Heart disease Father   . Hyperlipidemia Father   .  Hypertension Father     No Known Allergies  Current Outpatient Prescriptions on File Prior to Visit  Medication Sig Dispense Refill  . amlodipine-atorvastatin (CADUET) 10-20 MG per tablet TAKE 1 TABLET BY MOUTH DAILY.  90 tablet  3  . aspirin 325 MG tablet Take 325 mg by mouth daily.        Marland Kitchen BENICAR 40 MG tablet TAKE 1 TABLET EVERY DAY  90 tablet  3  . fish oil-omega-3 fatty acids 1000 MG capsule Take 2 g by mouth daily.        . metoprolol (LOPRESSOR) 50 MG tablet TAKE 1 TABLET TWICE A DAY  180 tablet  3  . multivitamin (THERAGRAN) per tablet Take 1 tablet by mouth daily.        . sertraline (ZOLOFT) 25 MG tablet TAKE 1 TABLET BY MOUTH EVERY DAY  30 tablet  5    BP 144/82  Pulse 76  Temp 98.4 F (36.9 C)  Resp 16  Ht 6' (1.829 m)  Wt 250 lb (113.399 kg)  BMI 33.91 kg/m2   '    Objective:   Physical Exam  Nursing note and vitals reviewed. Constitutional: He appears well-developed and well-nourished.  HENT:  Head: Normocephalic and atraumatic.  Eyes: Conjunctivae normal are normal. Pupils are equal, round, and reactive to light.  Neck: Normal range of motion. Neck supple.  Cardiovascular: Normal rate and regular rhythm.  Pulmonary/Chest: Effort normal and breath sounds normal.  Abdominal: Soft. Bowel sounds are normal.  Genitourinary:       hemorrhoids          Assessment & Plan:  discussed weight and exercise Monitoring thyroid monitor CBC for hx of anemia Continue the antidepressant Screens for worsening depression   I have spent more than 30 minutes examining this patient face-to-face of which over half was spent in counseling  strategy for dumping syndrome with increased psyllium.

## 2013-01-10 ENCOUNTER — Ambulatory Visit: Payer: Medicare Other | Admitting: Internal Medicine

## 2013-01-11 ENCOUNTER — Encounter: Payer: Self-pay | Admitting: Internal Medicine

## 2013-01-11 ENCOUNTER — Ambulatory Visit (INDEPENDENT_AMBULATORY_CARE_PROVIDER_SITE_OTHER): Payer: Medicare Other | Admitting: Internal Medicine

## 2013-01-11 VITALS — BP 130/78 | HR 64 | Temp 98.2°F | Resp 16 | Ht 73.0 in | Wt 251.0 lb

## 2013-01-11 DIAGNOSIS — E785 Hyperlipidemia, unspecified: Secondary | ICD-10-CM

## 2013-01-11 DIAGNOSIS — I10 Essential (primary) hypertension: Secondary | ICD-10-CM

## 2013-01-11 DIAGNOSIS — Z9109 Other allergy status, other than to drugs and biological substances: Secondary | ICD-10-CM

## 2013-01-11 DIAGNOSIS — J301 Allergic rhinitis due to pollen: Secondary | ICD-10-CM

## 2013-01-11 LAB — LIPID PANEL
HDL: 30.5 mg/dL — ABNORMAL LOW (ref >39.00)
Triglycerides: 241 mg/dL — ABNORMAL HIGH (ref 0.0–149.0)

## 2013-01-11 LAB — BASIC METABOLIC PANEL
CO2: 28 mEq/L (ref 19–32)
Calcium: 9.7 mg/dL (ref 8.4–10.5)
Potassium: 4.4 mEq/L (ref 3.5–5.1)
Sodium: 138 mEq/L (ref 135–145)

## 2013-01-11 LAB — LDL CHOLESTEROL, DIRECT: Direct LDL: 82.2 mg/dL

## 2013-01-11 NOTE — Patient Instructions (Signed)
The patient is instructed to continue all medications as prescribed. Schedule followup with check out clerk upon leaving the clinic  

## 2013-01-11 NOTE — Progress Notes (Signed)
Subjective:    Patient ID: Jermaine Castaneda, male    DOB: 24-Mar-1935, 78 y.o.   MRN: [id number]  Hypertension Pertinent negatives include no neck pain or shortness of breath.  Hyperlipidemia Pertinent negatives include no shortness of breath.   Patient is a 78 year old male who presents for followup of hypertension history of hyperlipidemia recent colonoscopy done in November of 2013 with 2 hyperplastic polyps removed.  Followed by regional cancer at [facility] on the zoloft Lipid management with Lipitor in [place]   Review of Systems  Constitutional: Negative for fever and fatigue.  HENT: Negative for hearing loss, congestion, neck pain and postnasal drip.   Eyes: Negative for discharge, redness and visual disturbance.  Respiratory: Negative for cough, shortness of breath and wheezing.   Gastrointestinal: Negative for abdominal pain, constipation and abdominal distention.  Genitourinary: Negative for urgency and frequency.  Musculoskeletal: Negative for joint swelling and arthralgias.  Skin: Negative for color change and rash.  Neurological: Negative for weakness and light-headedness.  Hematological: Negative for adenopathy.  Psychiatric/Behavioral: Negative for behavioral problems.   Past Medical History  Diagnosis Date  . CAD (coronary artery disease)   . Hyperlipidemia   . Hypertension     History   Social History  . Marital Status: Married    Spouse Name: N/A    Number of Children: N/A  . Years of Education: N/A   Occupational History  . Not on file.   Social History Main Topics  . Smoking status: Never Smoker   . Smokeless tobacco: Not on file  . Alcohol Use: Yes  . Drug Use: No  . Sexually Active: Yes   Other Topics Concern  . Not on file   Social History Narrative  . No narrative on file    Past Surgical History  Procedure Laterality Date  . Coronary artery bypass graft    . Coronary artery bypass graft  1996  . Total knee arthroplasty  2003     Family History  Problem Relation Age of Onset  . Coronary artery disease    . Heart disease Mother   . Heart disease Father   . Hyperlipidemia Father   . Hypertension Father     No Known Allergies  Current Outpatient Prescriptions on File Prior to Visit  Medication Sig Dispense Refill  . amlodipine-atorvastatin (CADUET) 10-20 MG per tablet TAKE 1 TABLET BY MOUTH DAILY.  90 tablet  3  . aspirin 325 MG tablet Take 325 mg by mouth daily.        Marland Kitchen BENICAR 40 MG tablet TAKE 1 TABLET EVERY DAY  90 tablet  3  . fish oil-omega-3 fatty acids 1000 MG capsule Take 2 g by mouth daily.        . metoprolol (LOPRESSOR) 50 MG tablet TAKE 1 TABLET TWICE A DAY  180 tablet  3  . multivitamin (THERAGRAN) per tablet Take 1 tablet by mouth daily.        . sertraline (ZOLOFT) 25 MG tablet TAKE 1 TABLET BY MOUTH EVERY DAY  30 tablet  5   No current facility-administered medications on file prior to visit.    BP 130/78  Pulse 64  Temp(Src) 98.2 F (36.8 C)  Resp 16  Ht 6\' 1"  (1.854 m)  Wt 251 lb (113.853 kg)  BMI 33.12 kg/m2   Past Medical History  Diagnosis Date  . CAD (coronary artery disease)   . Hyperlipidemia   . Hypertension     History  Social History  . Marital Status: Married    Spouse Name: N/A    Number of Children: N/A  . Years of Education: N/A   Occupational History  . Not on file.   Social History Main Topics  . Smoking status: Never Smoker   . Smokeless tobacco: Not on file  . Alcohol Use: Yes  . Drug Use: No  . Sexually Active: Yes   Other Topics Concern  . Not on file   Social History Narrative  . No narrative on file    Past Surgical History  Procedure Laterality Date  . Coronary artery bypass graft    . Coronary artery bypass graft  1996  . Total knee arthroplasty  2003    Family History  Problem Relation Age of Onset  . Coronary artery disease    . Heart disease Mother   . Heart disease Father   . Hyperlipidemia Father   . Hypertension  Father     No Known Allergies  Current Outpatient Prescriptions on File Prior to Visit  Medication Sig Dispense Refill  . amlodipine-atorvastatin (CADUET) 10-20 MG per tablet TAKE 1 TABLET BY MOUTH DAILY.  90 tablet  3  . aspirin 325 MG tablet Take 325 mg by mouth daily.        Marland Kitchen BENICAR 40 MG tablet TAKE 1 TABLET EVERY DAY  90 tablet  3  . fish oil-omega-3 fatty acids 1000 MG capsule Take 2 g by mouth daily.        . metoprolol (LOPRESSOR) 50 MG tablet TAKE 1 TABLET TWICE A DAY  180 tablet  3  . multivitamin (THERAGRAN) per tablet Take 1 tablet by mouth daily.        . sertraline (ZOLOFT) 25 MG tablet TAKE 1 TABLET BY MOUTH EVERY DAY  30 tablet  5   No current facility-administered medications on file prior to visit.    BP 130/78  Pulse 64  Temp(Src) 98.2 F (36.8 C)  Resp 16  Ht 6\' 1"  (1.854 m)  Wt 251 lb (113.853 kg)  BMI 33.12 kg/m2       Objective:   Physical Exam  Nursing note and vitals reviewed. Constitutional: He appears well-developed and well-nourished.  HENT:  Head: Normocephalic and atraumatic.  Eyes: Conjunctivae are normal. Pupils are equal, round, and reactive to light.  Neck: Normal range of motion. Neck supple.  Cardiovascular: Normal rate and regular rhythm.   Murmur heard. Pulmonary/Chest: Effort normal and breath sounds normal.  Abdominal: Soft. Bowel sounds are normal.          Assessment & Plan:  Weight and diet strategies discussed  Dry cough with allergies Trial of nasal inhaler Stable depression Stable HTN Monitor labs

## 2013-01-22 ENCOUNTER — Other Ambulatory Visit: Payer: Self-pay | Admitting: Internal Medicine

## 2013-04-11 ENCOUNTER — Other Ambulatory Visit: Payer: Self-pay | Admitting: Internal Medicine

## 2013-05-22 ENCOUNTER — Other Ambulatory Visit: Payer: Self-pay | Admitting: Internal Medicine

## 2013-07-16 ENCOUNTER — Ambulatory Visit (INDEPENDENT_AMBULATORY_CARE_PROVIDER_SITE_OTHER): Payer: Medicare Other | Admitting: Internal Medicine

## 2013-07-16 ENCOUNTER — Encounter: Payer: Self-pay | Admitting: Internal Medicine

## 2013-07-16 VITALS — BP 122/80 | HR 68 | Temp 98.2°F | Resp 16 | Ht 73.0 in | Wt 250.0 lb

## 2013-07-16 DIAGNOSIS — Z85038 Personal history of other malignant neoplasm of large intestine: Secondary | ICD-10-CM

## 2013-07-16 DIAGNOSIS — B351 Tinea unguium: Secondary | ICD-10-CM

## 2013-07-16 DIAGNOSIS — Z23 Encounter for immunization: Secondary | ICD-10-CM

## 2013-07-16 DIAGNOSIS — I10 Essential (primary) hypertension: Secondary | ICD-10-CM

## 2013-07-16 DIAGNOSIS — L570 Actinic keratosis: Secondary | ICD-10-CM

## 2013-07-16 NOTE — Progress Notes (Signed)
Subjective:    Patient ID: Jermaine Castaneda, male    DOB: 1935/03/06, 78 y.o.   MRN: [id number]  HPI CT for cancer follow up at [facility] and no evidence of metastatic disease Kidney stone noted   Review of Systems  Constitutional: Negative for fever and fatigue.  HENT: Negative for congestion, hearing loss and postnasal drip.   Eyes: Negative for discharge, redness and visual disturbance.  Respiratory: Negative for cough, shortness of breath and wheezing.   Cardiovascular: Negative for leg swelling.  Gastrointestinal: Negative for abdominal pain, constipation and abdominal distention.  Genitourinary: Negative for urgency and frequency.  Musculoskeletal: Negative for arthralgias, joint swelling and neck pain.  Skin: Negative for color change and rash.  Neurological: Negative for weakness and light-headedness.  Hematological: Negative for adenopathy.  Psychiatric/Behavioral: Negative for behavioral problems.   Past Medical History  Diagnosis Date  . CAD (coronary artery disease)   . Hyperlipidemia   . Hypertension     History   Social History  . Marital Status: Married    Spouse Name: N/A    Number of Children: N/A  . Years of Education: N/A   Occupational History  . Not on file.   Social History Main Topics  . Smoking status: Never Smoker   . Smokeless tobacco: Not on file  . Alcohol Use: Yes  . Drug Use: No  . Sexual Activity: Yes   Other Topics Concern  . Not on file   Social History Narrative  . No narrative on file    Past Surgical History  Procedure Laterality Date  . Coronary artery bypass graft    . Coronary artery bypass graft  1996  . Total knee arthroplasty  2003    Family History  Problem Relation Age of Onset  . Coronary artery disease    . Heart disease Mother   . Heart disease Father   . Hyperlipidemia Father   . Hypertension Father     No Known Allergies  Current Outpatient Prescriptions on File Prior to Visit  Medication Sig Dispense  Refill  . amlodipine-atorvastatin (CADUET) 10-20 MG per tablet TAKE 1 TABLET BY MOUTH DAILY.  90 tablet  3  . aspirin 325 MG tablet Take 325 mg by mouth daily.        Marland Kitchen BENICAR 40 MG tablet TAKE 1 TABLET EVERY DAY  90 tablet  3  . metoprolol (LOPRESSOR) 50 MG tablet TAKE 1 TABLET TWICE A DAY  180 tablet  3  . sertraline (ZOLOFT) 25 MG tablet TAKE 1 TABLET BY MOUTH EVERY DAY  30 tablet  5   No current facility-administered medications on file prior to visit.    BP 122/80  Pulse 68  Temp(Src) 98.2 F (36.8 C)  Resp 16  Ht 6\' 1"  (1.854 m)  Wt 250 lb (113.399 kg)  BMI 32.99 kg/m2       Objective:   Physical Exam  Constitutional: He appears well-developed and well-nourished.  HENT:  Head: Normocephalic and atraumatic.  Eyes: Conjunctivae are normal. Pupils are equal, round, and reactive to light.  Neck: Normal range of motion. Neck supple.  Cardiovascular: Normal rate and regular rhythm.   Murmur heard. Pulmonary/Chest: Effort normal and breath sounds normal.  Abdominal: Soft. Bowel sounds are normal.    AK on wrist right      Assessment & Plan:  Was not able to loose weight so reviewed diet and exercise reviewed CT of abd and pelvis for history of colon cancer Stable HTN  on benicar and amlopdipine Needed bmet  ... done at [facility] on right wrist  Informed consent was obtained in the lesion was treated for 60 seconds of liquid nitrogen application the patient tolerated the procedure well as procedural care was discussed with the patient and instructions should the lesion reappears contact our office immediately

## 2013-07-16 NOTE — Patient Instructions (Signed)
One cup of white vinegar in warm water soak for 20 min three times a week

## 2013-07-16 NOTE — Progress Notes (Signed)
Pre-visit discussion using our clinic review tool. No additional management support is needed unless otherwise documented below in the visit note.  

## 2013-08-01 ENCOUNTER — Other Ambulatory Visit: Payer: Self-pay | Admitting: Internal Medicine

## 2013-09-24 ENCOUNTER — Other Ambulatory Visit: Payer: Self-pay | Admitting: Internal Medicine

## 2013-10-04 ENCOUNTER — Telehealth: Payer: Self-pay | Admitting: Internal Medicine

## 2013-10-04 ENCOUNTER — Other Ambulatory Visit: Payer: Self-pay | Admitting: *Deleted

## 2013-10-04 ENCOUNTER — Ambulatory Visit (INDEPENDENT_AMBULATORY_CARE_PROVIDER_SITE_OTHER): Payer: Medicare HMO | Admitting: Internal Medicine

## 2013-10-04 ENCOUNTER — Encounter: Payer: Self-pay | Admitting: Internal Medicine

## 2013-10-04 ENCOUNTER — Ambulatory Visit (INDEPENDENT_AMBULATORY_CARE_PROVIDER_SITE_OTHER)
Admission: RE | Admit: 2013-10-04 | Discharge: 2013-10-04 | Disposition: A | Discharge status: Home / Self Care | Payer: Medicare HMO | Source: Ambulatory Visit | Attending: Internal Medicine | Admitting: Internal Medicine

## 2013-10-04 VITALS — BP 130/70 | HR 72 | Temp 97.7°F | Resp 20 | Ht 73.0 in | Wt 258.0 lb

## 2013-10-04 DIAGNOSIS — I2510 Atherosclerotic heart disease of native coronary artery without angina pectoris: Secondary | ICD-10-CM

## 2013-10-04 DIAGNOSIS — I10 Essential (primary) hypertension: Secondary | ICD-10-CM

## 2013-10-04 DIAGNOSIS — M6281 Muscle weakness (generalized): Secondary | ICD-10-CM

## 2013-10-04 DIAGNOSIS — E785 Hyperlipidemia, unspecified: Secondary | ICD-10-CM

## 2013-10-04 MED ORDER — CLOPIDOGREL BISULFATE 75 MG PO TABS: 75.0000 mg | ORAL_TABLET | Freq: Every day | ORAL | Status: DC

## 2013-10-04 NOTE — Progress Notes (Signed)
Subjective:    Patient ID: Jermaine Castaneda, male    DOB: [birth year], 79 y.o.   MRN: [id number]  HPI  79 year old patient who has a history of coronary artery disease hypertension and dyslipidemia. 5 days ago the patient tripped while making his bed. He stated that he fell to the floor on the side of the bed and believes that he landed on some pillows. Approximately 90 minutes later he had a 6 minute episode of weakness and numbness involving the right side. At the present time he states that he is having some difficulty writing with his right hand but otherwise no focal neurological deficits. There was no syncope and he really feels that he tripped rather than precipitated by other factors he is on daily aspirin  Past Medical History  Diagnosis Date  . CAD (coronary artery disease)   . Hyperlipidemia   . Hypertension     History   Social History  . Marital Status: Married    Spouse Name: N/A    Number of Children: N/A  . Years of Education: N/A   Occupational History  . Not on file.   Social History Main Topics  . Smoking status: Never Smoker   . Smokeless tobacco: Not on file  . Alcohol Use: Yes  . Drug Use: No  . Sexual Activity: Yes   Other Topics Concern  . Not on file   Social History Narrative  . No narrative on file    Past Surgical History  Procedure Laterality Date  . Coronary artery bypass graft    . Coronary artery bypass graft  1996  . Total knee arthroplasty  2003    Family History  Problem Relation Age of Onset  . Coronary artery disease    . Heart disease Mother   . Heart disease Father   . Hyperlipidemia Father   . Hypertension Father     No Known Allergies  Current Outpatient Prescriptions on File Prior to Visit  Medication Sig Dispense Refill  . amlodipine-atorvastatin (CADUET) 10-20 MG per tablet TAKE 1 TABLET BY MOUTH DAILY.  90 tablet  3  . aspirin 325 MG tablet Take 325 mg by mouth daily.        Marland Kitchen BENICAR 40 MG tablet TAKE 1 TABLET EVERY  DAY  90 tablet  3  . metoprolol (LOPRESSOR) 50 MG tablet TAKE 1 TABLET TWICE A DAY  180 tablet  3  . sertraline (ZOLOFT) 25 MG tablet TAKE 1 TABLET BY MOUTH EVERY DAY  30 tablet  5   No current facility-administered medications on file prior to visit.    BP 130/70  Pulse 72  Temp(Src) 97.7 F (36.5 C) (Oral)  Resp 20  Ht 6\' 1"  (1.854 m)  Wt 258 lb (117.028 kg)  BMI 34.05 kg/m2  SpO2 97%        Review of Systems  Constitutional: Negative for fever, chills, appetite change and fatigue.  HENT: Negative for congestion, dental problem, ear pain, hearing loss, sore throat, tinnitus, trouble swallowing and voice change.   Eyes: Negative for pain, discharge and visual disturbance.  Respiratory: Negative for cough, chest tightness, wheezing and stridor.   Cardiovascular: Negative for chest pain, palpitations and leg swelling.  Gastrointestinal: Negative for nausea, vomiting, abdominal pain, diarrhea, constipation, blood in stool and abdominal distention.  Genitourinary: Negative for urgency, hematuria, flank pain, discharge, difficulty urinating and genital sores.  Musculoskeletal: Negative for arthralgias, back pain, gait problem, joint swelling, myalgias and neck stiffness.  Skin: Negative for rash.  Neurological: Positive for weakness and numbness. Negative for dizziness, syncope, speech difficulty and headaches.  Hematological: Negative for adenopathy. Does not bruise/bleed easily.  Psychiatric/Behavioral: Negative for behavioral problems and dysphoric mood. The patient is not nervous/anxious.        Objective:   Physical Exam  Constitutional: He is oriented to person, place, and time. He appears well-developed.  HENT:  Head: Normocephalic.  Right Ear: External ear normal.  Left Ear: External ear normal.  Eyes: Conjunctivae and EOM are normal.  Neck: Normal range of motion.  Cardiovascular: Normal rate and normal heart sounds.   Pulmonary/Chest: Breath sounds normal.    Abdominal: Bowel sounds are normal.  Musculoskeletal: Normal range of motion. He exhibits no edema and no tenderness.  Neurological: He is alert and oriented to person, place, and time. He has normal reflexes. No cranial nerve deficit. Coordination normal.  mild right hand dyspraxia  Psychiatric: He has a normal mood and affect. His behavior is normal.          Assessment & Plan:   Probable subacute left brain stroke. We'll check a head CT without contrast and also check carotid artery Doppler studies and 2-D echocardiogram. Head CT negative for acute bleed we'll consider adding Plavix for at least 30 days Hypertension Dyslipidemia Coronary artery disease

## 2013-10-04 NOTE — Telephone Encounter (Signed)
Patient Information:  Caller Name: Jermaine Castaneda  Phone: [phone]  Patient: Jermaine Castaneda  Gender: Male  DOB: 01-Feb-1935  Age: 78 Years  PCP: Jermaine Castaneda (Adults only)  Office Follow Up:  Does the office need to follow up with this patient?: No  Instructions For The Office: N/A  RN Note:  will obtain appt. for this pt.  Symptoms  Reason For Call & Symptoms: [place] and tripped over a mattress on 09/29/13 and hit the wall. Two hours later, Rt. side became numb and gradually became better. No pain or bleeding. Now difficulty writing a check.  Reviewed Health History In EMR: Yes  Reviewed Medications In EMR: Yes  Reviewed Allergies In EMR: Yes  Reviewed Surgeries / Procedures: Yes  Date of Onset of Symptoms: 09/29/2013  Guideline(s) Used:  Fainting  Disposition Per Guideline:   Go to ED Now (or to Office with PCP Approval)  Reason For Disposition Reached:   Age > 50 years  Advice Given:  N/A  Patient Will Follow Care Advice:  YES  Appointment Scheduled:  10/04/2013 13:00:00 Appointment Scheduled Provider:  Bluford Kaufmann (Family Practice > 56yrs old)

## 2013-10-04 NOTE — Progress Notes (Signed)
Pre-visit discussion using our clinic review tool. No additional management support is needed unless otherwise documented below in the visit note.  

## 2013-10-04 NOTE — Telephone Encounter (Signed)
Dr. Raliegh Ip requested that patient come back next week for a follow up - keeping an eye on him making sure he doesn't have a stroke. Dr. Arnoldo Morale doesn't have an available slot next week, so I schedule him with Dr. Raliegh Ip. Thanks!!

## 2013-10-04 NOTE — Patient Instructions (Signed)
Limit your sodium (Salt) intake  Head CT today as discussed  Report any new or worsening symptoms immediately

## 2013-10-11 ENCOUNTER — Encounter: Payer: Self-pay | Admitting: Internal Medicine

## 2013-10-11 ENCOUNTER — Ambulatory Visit (INDEPENDENT_AMBULATORY_CARE_PROVIDER_SITE_OTHER): Payer: Medicare HMO | Admitting: Internal Medicine

## 2013-10-11 ENCOUNTER — Ambulatory Visit (HOSPITAL_COMMUNITY): Payer: Medicare HMO | Attending: Cardiology

## 2013-10-11 VITALS — BP 120/70 | HR 66 | Temp 98.3°F | Resp 20 | Ht 73.0 in | Wt 256.0 lb

## 2013-10-11 DIAGNOSIS — I6529 Occlusion and stenosis of unspecified carotid artery: Secondary | ICD-10-CM | POA: Insufficient documentation

## 2013-10-11 DIAGNOSIS — M6281 Muscle weakness (generalized): Secondary | ICD-10-CM

## 2013-10-11 DIAGNOSIS — I10 Essential (primary) hypertension: Secondary | ICD-10-CM | POA: Insufficient documentation

## 2013-10-11 DIAGNOSIS — I679 Cerebrovascular disease, unspecified: Secondary | ICD-10-CM

## 2013-10-11 DIAGNOSIS — G459 Transient cerebral ischemic attack, unspecified: Secondary | ICD-10-CM

## 2013-10-11 DIAGNOSIS — R209 Unspecified disturbances of skin sensation: Secondary | ICD-10-CM | POA: Insufficient documentation

## 2013-10-11 DIAGNOSIS — I658 Occlusion and stenosis of other precerebral arteries: Secondary | ICD-10-CM | POA: Insufficient documentation

## 2013-10-11 DIAGNOSIS — I2510 Atherosclerotic heart disease of native coronary artery without angina pectoris: Secondary | ICD-10-CM | POA: Insufficient documentation

## 2013-10-11 DIAGNOSIS — R55 Syncope and collapse: Secondary | ICD-10-CM

## 2013-10-11 DIAGNOSIS — E785 Hyperlipidemia, unspecified: Secondary | ICD-10-CM | POA: Insufficient documentation

## 2013-10-11 MED ORDER — ASPIRIN EC 81 MG PO TBEC: 81.0000 mg | DELAYED_RELEASE_TABLET | Freq: Every day | ORAL | Status: DC

## 2013-10-11 NOTE — Progress Notes (Signed)
Subjective:    Patient ID: Jermaine Castaneda, male    DOB: 24-Aug-1935, 79 y.o.   MRN: [id number]  HPI 79 year old patient who has coronary artery disease who was seen one week ago with a complaint of right-sided numbness and weakness. Symptoms lasted about 6 minutes and occurred 5 days prior to this visit. A head CT was unremarkable and he now is on Plavix as well as aspirin. Carotid artery Doppler studies and 2-D echocardiograms are pending He feels that he is back to a normal with only minimal changes with fine motor activities such as handwriting  Past Medical History  Diagnosis Date  . CAD (coronary artery disease)   . Hyperlipidemia   . Hypertension     History   Social History  . Marital Status: Married    Spouse Name: N/A    Number of Children: N/A  . Years of Education: N/A   Occupational History  . Not on file.   Social History Main Topics  . Smoking status: Never Smoker   . Smokeless tobacco: Not on file  . Alcohol Use: Yes  . Drug Use: No  . Sexual Activity: Yes   Other Topics Concern  . Not on file   Social History Narrative  . No narrative on file    Past Surgical History  Procedure Laterality Date  . Coronary artery bypass graft    . Coronary artery bypass graft  1996  . Total knee arthroplasty  2003    Family History  Problem Relation Age of Onset  . Coronary artery disease    . Heart disease Mother   . Heart disease Father   . Hyperlipidemia Father   . Hypertension Father     No Known Allergies  Current Outpatient Prescriptions on File Prior to Visit  Medication Sig Dispense Refill  . amlodipine-atorvastatin (CADUET) 10-20 MG per tablet TAKE 1 TABLET BY MOUTH DAILY.  90 tablet  3  . aspirin 325 MG tablet Take 325 mg by mouth daily.        Marland Kitchen BENICAR 40 MG tablet TAKE 1 TABLET EVERY DAY  90 tablet  3  . clopidogrel (PLAVIX) 75 MG tablet Take 1 tablet (75 mg total) by mouth daily with breakfast.  30 tablet  1  . hydrocortisone (ANUSOL-HC) 2.5 %  rectal cream Place 1 application rectally as needed for Hemorrhoids.      . metoprolol (LOPRESSOR) 50 MG tablet TAKE 1 TABLET TWICE A DAY  180 tablet  3  . sertraline (ZOLOFT) 25 MG tablet TAKE 1 TABLET BY MOUTH EVERY DAY  30 tablet  5   No current facility-administered medications on file prior to visit.    BP 120/70  Pulse 66  Temp(Src) 98.3 F (36.8 C) (Oral)  Resp 20  Ht 6\' 1"  (1.854 m)  Wt 256 lb (116.121 kg)  BMI 33.78 kg/m2  SpO2 96%      Review of Systems  Constitutional: Negative for fever, chills, appetite change and fatigue.  HENT: Negative for congestion, dental problem, ear pain, hearing loss, sore throat, tinnitus, trouble swallowing and voice change.   Eyes: Negative for pain, discharge and visual disturbance.  Respiratory: Negative for cough, chest tightness, wheezing and stridor.   Cardiovascular: Negative for chest pain, palpitations and leg swelling.  Gastrointestinal: Negative for nausea, vomiting, abdominal pain, diarrhea, constipation, blood in stool and abdominal distention.  Genitourinary: Negative for urgency, hematuria, flank pain, discharge, difficulty urinating and genital sores.  Musculoskeletal: Negative for arthralgias, back  pain, gait problem, joint swelling, myalgias and neck stiffness.  Skin: Negative for rash.  Neurological: Positive for weakness and numbness. Negative for dizziness, syncope, speech difficulty and headaches.  Hematological: Negative for adenopathy. Does not bruise/bleed easily.  Psychiatric/Behavioral: Negative for behavioral problems and dysphoric mood. The patient is not nervous/anxious.        Objective:   Physical Exam  Constitutional: He is oriented to person, place, and time. He appears well-developed and well-nourished. No distress.  HENT:  Head: Normocephalic.  Right Ear: External ear normal.  Left Ear: External ear normal.  Eyes: Conjunctivae and EOM are normal.  Neck: Normal range of motion.  Cardiovascular:  Normal rate and normal heart sounds.   Pulmonary/Chest: Breath sounds normal.  Abdominal: Bowel sounds are normal.  Musculoskeletal: Normal range of motion. He exhibits no edema and no tenderness.  Neurological: He is alert and oriented to person, place, and time. He has normal reflexes. No cranial nerve deficit.  Motor examination normal No drift Sensory abnormalities Finger to nose and rapid alternating movements normal No obvious dyspraxia of the right hand  Psychiatric: He has a normal mood and affect. His behavior is normal.          Assessment & Plan:   Status post left brain subacute stroke. Head CT scan normal. Carotid artery Doppler studies and 2-D echocardiogram pending. We'll continue on a regimen of aspirin and Plavix at least for 30 days. He has been asked to decrease his aspirin dose to 81 mg daily Hypertension stable Dyslipidemia. Continue statin therapy  We'll report any new neurological symptoms

## 2013-10-11 NOTE — Progress Notes (Signed)
Pre-visit discussion using our clinic review tool. No additional management support is needed unless otherwise documented below in the visit note.  

## 2013-10-11 NOTE — Patient Instructions (Signed)
Report any new neurological symptoms such as weakness or numbness or any speech difficulties  Decrease aspirin to 81 mg daily  Followup one month  2-D echocardiogram and carotid artery Doppler study as ordered

## 2013-10-18 ENCOUNTER — Ambulatory Visit (HOSPITAL_COMMUNITY): Payer: Medicare HMO | Attending: Internal Medicine | Admitting: Radiology

## 2013-10-18 DIAGNOSIS — E039 Hypothyroidism, unspecified: Secondary | ICD-10-CM | POA: Insufficient documentation

## 2013-10-18 DIAGNOSIS — I25810 Atherosclerosis of coronary artery bypass graft(s) without angina pectoris: Secondary | ICD-10-CM

## 2013-10-18 DIAGNOSIS — I10 Essential (primary) hypertension: Secondary | ICD-10-CM | POA: Insufficient documentation

## 2013-10-18 DIAGNOSIS — E669 Obesity, unspecified: Secondary | ICD-10-CM | POA: Insufficient documentation

## 2013-10-18 DIAGNOSIS — E785 Hyperlipidemia, unspecified: Secondary | ICD-10-CM | POA: Insufficient documentation

## 2013-10-18 DIAGNOSIS — M6281 Muscle weakness (generalized): Secondary | ICD-10-CM

## 2013-10-18 NOTE — Progress Notes (Signed)
Echocardiogram performed.  

## 2013-10-19 ENCOUNTER — Encounter: Payer: Self-pay | Admitting: Internal Medicine

## 2013-10-31 ENCOUNTER — Other Ambulatory Visit (HOSPITAL_COMMUNITY): Payer: Medicare HMO

## 2013-11-26 ENCOUNTER — Other Ambulatory Visit: Payer: Self-pay | Admitting: Internal Medicine

## 2013-12-03 ENCOUNTER — Other Ambulatory Visit: Payer: Self-pay | Admitting: Internal Medicine

## 2013-12-05 ENCOUNTER — Ambulatory Visit: Payer: Medicare Other | Admitting: Internal Medicine

## 2013-12-07 ENCOUNTER — Ambulatory Visit (INDEPENDENT_AMBULATORY_CARE_PROVIDER_SITE_OTHER): Payer: Medicare HMO | Admitting: Internal Medicine

## 2013-12-07 ENCOUNTER — Telehealth: Payer: Self-pay | Admitting: Internal Medicine

## 2013-12-07 ENCOUNTER — Encounter: Payer: Self-pay | Admitting: Internal Medicine

## 2013-12-07 VITALS — BP 110/68 | HR 64 | Temp 98.0°F | Wt 249.0 lb

## 2013-12-07 DIAGNOSIS — I679 Cerebrovascular disease, unspecified: Secondary | ICD-10-CM

## 2013-12-07 DIAGNOSIS — E785 Hyperlipidemia, unspecified: Secondary | ICD-10-CM

## 2013-12-07 DIAGNOSIS — I10 Essential (primary) hypertension: Secondary | ICD-10-CM

## 2013-12-07 DIAGNOSIS — E782 Mixed hyperlipidemia: Secondary | ICD-10-CM

## 2013-12-07 NOTE — Progress Notes (Signed)
Subjective:    Patient ID: Jermaine Castaneda, male    DOB: 09-23-1934, 79 y.o.   MRN: [id number]  Hypertension Pertinent negatives include no neck pain or shortness of breath.   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   Review of Systems  Constitutional: Negative for fever and fatigue.  HENT: Negative for congestion, hearing loss and postnasal drip.   Eyes: Negative for discharge, redness and visual disturbance.  Respiratory: Negative for cough, shortness of breath and wheezing.   Cardiovascular: Negative for leg swelling.  Gastrointestinal: Negative for abdominal pain, constipation and abdominal distention.  Genitourinary: Negative for urgency and frequency.  Musculoskeletal: Negative for arthralgias, joint swelling and neck pain.  Skin: Negative for color change and rash.  Neurological: Negative for weakness and light-headedness.  Hematological: Negative for adenopathy.  Psychiatric/Behavioral: Negative for behavioral problems.   Past Medical History  Diagnosis Date  . CAD (coronary artery disease)   . Hyperlipidemia   . Hypertension     History   Social History  . Marital Status: Married    Spouse Name: N/A    Number of Children: N/A  . Years of Education: N/A   Occupational History  . Not on file.   Social History Main Topics  . Smoking status: Never Smoker   . Smokeless tobacco: Not on file  . Alcohol Use: Yes  . Drug Use: No  . Sexual Activity: Yes   Other Topics Concern  . Not on file   Social History Narrative  . No narrative on file    Past Surgical History  Procedure Laterality Date  . Coronary artery bypass graft    . Coronary artery bypass graft  1996  . Total knee arthroplasty  2003    Family History  Problem Relation Age of Onset  . Coronary artery disease    . Heart disease Mother   . Heart disease Father   .  Hyperlipidemia Father   . Hypertension Father     No Known Allergies  Current Outpatient Prescriptions on File Prior to Visit  Medication Sig Dispense Refill  . amlodipine-atorvastatin (CADUET) 10-20 MG per tablet TAKE 1 TABLET BY MOUTH DAILY.  90 tablet  3  . aspirin EC 81 MG tablet Take 1 tablet (81 mg total) by mouth daily.      Marland Kitchen BENICAR 40 MG tablet TAKE 1 TABLET EVERY DAY  90 tablet  3  . clopidogrel (PLAVIX) 75 MG tablet TAKE 1 TABLET (75 MG TOTAL) BY MOUTH DAILY WITH BREAKFAST.  30 tablet  3  . clopidogrel (PLAVIX) 75 MG tablet TAKE 1 TABLET (75 MG TOTAL) BY MOUTH DAILY WITH BREAKFAST.  30 tablet  1  . hydrocortisone (ANUSOL-HC) 2.5 % rectal cream Place 1 application rectally as needed for Hemorrhoids.      . metoprolol (LOPRESSOR) 50 MG tablet TAKE 1 TABLET TWICE A DAY  180 tablet  3  . sertraline (ZOLOFT) 25 MG tablet TAKE 1 TABLET BY MOUTH EVERY DAY  30 tablet  5   No current facility-administered medications on file prior to visit.    BP 110/68  Pulse 64  Temp(Src) 98 F (36.7 C) (Oral)  Wt 249 lb (112.946 kg)       Objective:   Physical Exam  Constitutional: He appears well-developed and well-nourished.  HENT:  Head: Normocephalic and atraumatic.  Eyes: Conjunctivae are normal. Pupils  are equal, round, and reactive to light.  Neck: Normal range of motion. Neck supple.  Cardiovascular: Normal rate and regular rhythm.   Pulmonary/Chest: Effort normal and breath sounds normal.  Abdominal: Soft. Bowel sounds are normal.  Neurological:  Mild dyspraxia  Skin: Skin is warm and dry.    Mini neuro exam intact  Echo nl EF       Assessment & Plan:  TIA vs small CVA Moderate carotid dx bilaterally at 50%  With need for yearly duplex HTN stable  Make sure remains on aspirin optimize lipid control  On lipitor Weight loss!!!  Increased TG risk for CVA needs weight loss

## 2013-12-07 NOTE — Patient Instructions (Addendum)
Jermaine Castaneda,  Risk for you is that your weight gain has allowed  triglycerides to increase and elevated triglycerides are significant risk for stroke. The elevation in the triglycerides actually thickened your  blood and put you at risk for creating an emboli.  Cutting out the sugar Losing weight And walking daily are the keys to preventing another more serious neurologic event   [place] [facility] [employer]

## 2013-12-07 NOTE — Progress Notes (Signed)
Pre visit review using our clinic review tool, if applicable. No additional management support is needed unless otherwise documented below in the visit note. 

## 2013-12-07 NOTE — Telephone Encounter (Signed)
Relevant patient education mailed to patient.  

## 2014-01-08 ENCOUNTER — Other Ambulatory Visit: Payer: Self-pay | Admitting: Internal Medicine

## 2014-04-08 ENCOUNTER — Other Ambulatory Visit: Payer: Medicare HMO

## 2014-04-08 ENCOUNTER — Other Ambulatory Visit: Payer: Self-pay | Admitting: Internal Medicine

## 2014-05-06 ENCOUNTER — Other Ambulatory Visit: Payer: Self-pay | Admitting: Internal Medicine

## 2014-06-19 ENCOUNTER — Telehealth: Payer: Self-pay | Admitting: Internal Medicine

## 2014-06-19 MED ORDER — AMLODIPINE-ATORVASTATIN 10-20 MG PO TABS: ORAL_TABLET | ORAL | Status: DC

## 2014-06-19 NOTE — Telephone Encounter (Signed)
[id number] - [place], [place] - [address] is requesting 90 day re-fill on amlodipine-atorvastatin (CADUET) 10-20 MG per tablet

## 2014-06-19 NOTE — Telephone Encounter (Signed)
rx sent in electronically 

## 2015-03-05 ENCOUNTER — Telehealth: Payer: Self-pay | Admitting: Family Medicine

## 2015-03-05 NOTE — Telephone Encounter (Signed)
Refill request for Clopidogrel and [facility] to [facility].

## 2015-03-05 NOTE — Telephone Encounter (Signed)
Patient needs to schedule appointment to establish with provider. Called patient to make aware but received voicemail. Left message to call back.

## 2015-03-05 NOTE — Telephone Encounter (Signed)
Talked to patient. Patient states they see another provider and never sent in request for this refill.

## 2015-03-06 ENCOUNTER — Other Ambulatory Visit: Payer: Self-pay | Admitting: *Deleted

## 2015-03-07 ENCOUNTER — Telehealth: Payer: Self-pay | Admitting: Internal Medicine

## 2015-03-07 NOTE — Telephone Encounter (Signed)
Corene Cornea from [facility] [phone]) is needing clopidogrel (PLAVIX) 75 MG tablet refilled.

## 2019-01-10 ENCOUNTER — Emergency Department (HOSPITAL_COMMUNITY): Payer: Medicare HMO

## 2019-01-10 ENCOUNTER — Emergency Department (HOSPITAL_COMMUNITY)
Admission: EM | Admit: 2019-01-10 | Discharge: 2019-01-10 | Disposition: A | Discharge status: Home / Self Care | Payer: Medicare HMO | Attending: Emergency Medicine | Admitting: Emergency Medicine

## 2019-01-10 ENCOUNTER — Other Ambulatory Visit: Payer: Self-pay

## 2019-01-10 DIAGNOSIS — Z7982 Long term (current) use of aspirin: Secondary | ICD-10-CM | POA: Insufficient documentation

## 2019-01-10 DIAGNOSIS — W1839XA Other fall on same level, initial encounter: Secondary | ICD-10-CM | POA: Insufficient documentation

## 2019-01-10 DIAGNOSIS — I10 Essential (primary) hypertension: Secondary | ICD-10-CM | POA: Insufficient documentation

## 2019-01-10 DIAGNOSIS — Y929 Unspecified place or not applicable: Secondary | ICD-10-CM | POA: Diagnosis not present

## 2019-01-10 DIAGNOSIS — Y939 Activity, unspecified: Secondary | ICD-10-CM | POA: Insufficient documentation

## 2019-01-10 DIAGNOSIS — S0182XA Laceration with foreign body of other part of head, initial encounter: Secondary | ICD-10-CM | POA: Insufficient documentation

## 2019-01-10 DIAGNOSIS — T07XXXA Unspecified multiple injuries, initial encounter: Secondary | ICD-10-CM

## 2019-01-10 DIAGNOSIS — Z79899 Other long term (current) drug therapy: Secondary | ICD-10-CM | POA: Insufficient documentation

## 2019-01-10 DIAGNOSIS — Y999 Unspecified external cause status: Secondary | ICD-10-CM | POA: Diagnosis not present

## 2019-01-10 DIAGNOSIS — S0181XA Laceration without foreign body of other part of head, initial encounter: Secondary | ICD-10-CM

## 2019-01-10 MED ORDER — ACETAMINOPHEN 325 MG PO TABS
650.0000 mg | ORAL_TABLET | Freq: Once | ORAL | Status: DC
  Filled 2019-01-10: qty 2

## 2019-01-10 MED ORDER — LIDOCAINE-EPINEPHRINE (PF) 2 %-1:200000 IJ SOLN: 20.0000 mL | Freq: Once | INTRAMUSCULAR | Status: DC

## 2019-01-10 MED ORDER — BACITRACIN ZINC 500 UNIT/GM EX OINT
TOPICAL_OINTMENT | Freq: Two times a day (BID) | CUTANEOUS | Status: DC
  Administered 2019-01-10: 05:00:00 via TOPICAL
  Filled 2019-01-10: qty 0.9

## 2019-01-10 MED ORDER — LIDOCAINE HCL 2 % IJ SOLN
INTRAMUSCULAR | Status: AC
  Filled 2019-01-10: qty 20

## 2019-01-10 MED ORDER — BACITRACIN ZINC 500 UNIT/GM EX OINT: 1.0000 "application " | TOPICAL_OINTMENT | Freq: Two times a day (BID) | CUTANEOUS | 0 refills | Status: DC

## 2019-01-10 NOTE — ED Notes (Signed)
Wound dressed. Pt resting comfortably in bed sleeping. Awaiting transport home. NAD

## 2019-01-10 NOTE — ED Triage Notes (Signed)
Pt arrived via EMS after mechanical fall out of bed. NO thinners. Stitches placed 2 days ago for the same sort of incident and stitches have torn. Pt has hx of dementia but is A&o x3. Remembers what happened. Falls increasing lately. From a nursing home, [facility].

## 2019-01-10 NOTE — Discharge Instructions (Signed)
We saw in the ER after he had a fall. All the imaging results appear normal. You had a deep and jagged laceration that was repaired with dissolvable sutures.  Take good care of the wound and apply the topical ointment as prescribed.  Return to the ER immediately if you start having pus drainage or signs of infection in the wound site.

## 2019-01-10 NOTE — ED Notes (Signed)
Pt back from CT

## 2019-01-10 NOTE — ED Notes (Signed)
Bed: NR04 Expected date:  Expected time:  Means of arrival:  Comments: 84 yr old fall, hematoma to forehead

## 2019-01-10 NOTE — ED Notes (Signed)
PTAR called for transport home. 

## 2019-01-10 NOTE — ED Notes (Signed)
PTAR at bedside to transport home.  

## 2019-01-10 NOTE — ED Notes (Signed)
Pt reports it hurts to breath in his chest.

## 2019-01-10 NOTE — ED Notes (Signed)
Suture supplies placed at bedside for provider. PT still at imaging.

## 2019-01-10 NOTE — ED Notes (Signed)
PTAR given and verbalized understanding of d/c instructions. Attempted to call the facility but was unable to reach anyone. Told to return pt if s/s worsen. No further distress or questions upon leaving facility.

## 2019-01-10 NOTE — ED Notes (Signed)
Patient transported to X-ray 

## 2019-01-10 NOTE — ED Provider Notes (Signed)
[facility] DEPT Provider Note   CSN: [id number] Arrival date & time: 01/10/19  0105    History   Chief Complaint Chief Complaint  Patient presents with   Fall    HPI Jermaine Castaneda is a 84 y.o. male.     HPI  84 year old male comes to the ER with chief complaint of fall. Patient has history of hypertension, CAD, hyperlipidemia.  He reports that he was walking without a walker and fell down.  According to the nursing home report, patient was found down from his bed.  He had a fall 2 days ago.  He has had increased falls recently.  Patient has a laceration to his forehead.  He complains of pain to his right shoulder, chest, right leg.  Past Medical History:  Diagnosis Date   CAD (coronary artery disease)    Hyperlipidemia    Hypertension     Patient Active Problem List   Diagnosis Date Noted   Cerebrovascular disease 10/11/2013   Hemorrhoids, external with complications 40/98/1191   DUMPING SYNDROME 03/16/2010   MALIGNANT NEOPLASM OF COLON UNSPECIFIED SITE 02/11/2010   COLONIC POLYPS 12/11/2009   ACTINIC KERATOSIS 07/15/2009   LOC OSTEOARTHROS NOT SPEC PRIM/SEC LOWER LEG 10/02/2007   HYPOTHYROIDISM 09/26/2007   ANEMIA 09/26/2007   PSA, INCREASED 09/26/2007   HYPERLIPIDEMIA 04/27/2007   HYPERTENSION 04/27/2007   CORONARY ARTERY DISEASE 04/27/2007    Past Surgical History:  Procedure Laterality Date   CORONARY ARTERY BYPASS GRAFT     CORONARY ARTERY BYPASS GRAFT  1996   TOTAL KNEE ARTHROPLASTY  2003        Home Medications    Prior to Admission medications   Medication Sig Start Date End Date Taking? Authorizing Provider  amlodipine-atorvastatin (CADUET) 10-20 MG per tablet TAKE 1 TABLET BY MOUTH DAILY. 06/19/14   Ricard Dillon, MD  aspirin EC 81 MG tablet Take 1 tablet (81 mg total) by mouth daily. 10/11/13   Marletta Lor, MD  bacitracin ointment Apply 1 application topically 2 (two) times daily.  01/10/19   Varney Biles, MD  BENICAR 40 MG tablet TAKE 1 TABLET EVERY DAY 09/24/13   Ricard Dillon, MD  clopidogrel (PLAVIX) 75 MG tablet TAKE 1 TABLET BY MOUTH EVERY DAY WITH BREAKFAST 04/08/14   Ricard Dillon, MD  hydrocortisone (ANUSOL-HC) 2.5 % rectal cream Place 1 application rectally as needed for Hemorrhoids.    [provider]  hydrocortisone cream 1 % Apply 1 application topically 2 (two) times daily. OTC    [provider]  metoprolol (LOPRESSOR) 50 MG tablet TAKE 1 TABLET TWICE A DAY 05/07/14   Ricard Dillon, MD  sertraline (ZOLOFT) 25 MG tablet TAKE 1 TABLET BY MOUTH EVERY DAY 01/08/14   Ricard Dillon, MD    Family History Family History  Problem Relation Age of Onset   Coronary artery disease Unknown    Heart disease Mother    Heart disease Father    Hyperlipidemia Father    Hypertension Father     Social History Social History   Tobacco Use   Smoking status: Never Smoker  Substance Use Topics   Alcohol use: Yes   Drug use: No     Allergies   Patient has no known allergies.   Review of Systems Review of Systems  Constitutional: Positive for activity change.  Respiratory: Negative for shortness of breath.   Cardiovascular: Negative for chest pain.  Gastrointestinal: Negative for nausea.  Musculoskeletal: Positive  for arthralgias and myalgias.  Skin: Positive for wound.  Neurological: Positive for headaches.  Hematological: Does not bruise/bleed easily.  All other systems reviewed and are negative.    Physical Exam Updated Vital Signs BP (!) 154/84    Pulse 76    Temp 98.4 F (36.9 C) (Axillary)    Resp 17    Ht 6' (1.829 m)    Wt 110 kg    SpO2 99%    BMI 32.89 kg/m   Physical Exam Vitals signs and nursing note reviewed.  Constitutional:      Appearance: He is well-developed.  HENT:     Head: Atraumatic.  Neck:     Musculoskeletal: Neck supple.  Cardiovascular:     Rate and Rhythm: Normal rate.  Pulmonary:      Effort: Pulmonary effort is normal.  Musculoskeletal:        General: Tenderness present. No deformity.     Right lower leg: No edema.     Left lower leg: No edema.     Comments: Patient has tenderness over the right shoulder and right hip.  Tenderness is appreciated when patient actually tries to move those extremities.  He also has a 5 cm laceration to his forehead on the right side.  Otherwise: Head to toe evaluation shows no hematoma, bleeding of the scalp, no facial abrasions, no spine step offs, crepitus of the chest or neck, no tenderness to palpation of the bilateral upper and lower extremities, no gross deformities, no chest tenderness, no pelvic pain.   Skin:    General: Skin is warm.  Neurological:     Mental Status: He is alert and oriented to person, place, and time.      ED Treatments / Results  Labs (all labs ordered are listed, but only abnormal results are displayed) Labs Reviewed - No data to display  EKG None  Radiology Dg Chest 2 View  Result Date: 01/10/2019 CLINICAL DATA:  Fall EXAM: CHEST - 2 VIEW COMPARISON:  None. FINDINGS: The heart size and mediastinal contours are within normal limits. Both lungs are clear. The visualized skeletal structures are unremarkable. Remote median sternotomy. IMPRESSION: No active cardiopulmonary disease. Electronically Signed   By: Ulyses Jarred M.D.   On: 01/10/2019 03:13   Dg Shoulder Right  Result Date: 01/10/2019 CLINICAL DATA:  84 year old male with fall and right shoulder pain. EXAM: RIGHT SHOULDER - 2+ VIEW COMPARISON:  Chest radiograph dated 01/10/2019 FINDINGS: There is no acute fracture or dislocation. The bones are osteopenic. Degenerative changes and chronic irregularity of the glenoid. Old healed right posterior rib fractures. The soft tissues are unremarkable. IMPRESSION: No acute fracture or dislocation. Electronically Signed   By: Anner Crete M.D.   On: 01/10/2019 03:14   Ct Head Wo Contrast  Result Date:  01/10/2019 CLINICAL DATA:  Fall EXAM: CT HEAD WITHOUT CONTRAST CT CERVICAL SPINE WITHOUT CONTRAST TECHNIQUE: Multidetector CT imaging of the head and cervical spine was performed following the standard protocol without intravenous contrast. Multiplanar CT image reconstructions of the cervical spine were also generated. COMPARISON:  CT head 10/04/2013 FINDINGS: CT HEAD FINDINGS Brain: There is no mass, hemorrhage or extra-axial collection. There is generalized atrophy without lobar predilection. Areas of hypoattenuation of the deep gray nuclei and confluent periventricular white matter hypodensity, consistent with chronic small vessel disease. Vascular: Atherosclerotic calcification of the internal carotid arteries at the skull base. No abnormal hyperdensity of the major intracranial arteries or dural venous sinuses. Skull: Right frontal scalp hematoma.  No skull fracture. Sinuses/Orbits: No fluid levels or advanced mucosal thickening of the visualized paranasal sinuses. No mastoid or middle ear effusion. The orbits are normal. CT CERVICAL SPINE FINDINGS Alignment: No static subluxation. Facets are aligned. Occipital condyles are normally positioned. Skull base and vertebrae: No acute fracture. Sclerotic lesion at C2 is likely a bone island. Soft tissues and spinal canal: No prevertebral fluid or swelling. No visible canal hematoma. Disc levels: Multilevel facet hypertrophy worst at C3-4 and C4-5. No bony spinal canal stenosis. Upper chest: No pneumothorax, pulmonary nodule or pleural effusion. Other: Normal visualized paraspinal cervical soft tissues. IMPRESSION: 1. Chronic ischemic microangiopathy and generalized volume loss without acute intracranial abnormality. 2. Multilevel cervical degenerative disc and facet disease without acute fracture or static subluxation. Electronically Signed   By: Ulyses Jarred M.D.   On: 01/10/2019 03:20   Ct Cervical Spine Wo Contrast  Result Date: 01/10/2019 CLINICAL DATA:   Fall EXAM: CT HEAD WITHOUT CONTRAST CT CERVICAL SPINE WITHOUT CONTRAST TECHNIQUE: Multidetector CT imaging of the head and cervical spine was performed following the standard protocol without intravenous contrast. Multiplanar CT image reconstructions of the cervical spine were also generated. COMPARISON:  CT head 10/04/2013 FINDINGS: CT HEAD FINDINGS Brain: There is no mass, hemorrhage or extra-axial collection. There is generalized atrophy without lobar predilection. Areas of hypoattenuation of the deep gray nuclei and confluent periventricular white matter hypodensity, consistent with chronic small vessel disease. Vascular: Atherosclerotic calcification of the internal carotid arteries at the skull base. No abnormal hyperdensity of the major intracranial arteries or dural venous sinuses. Skull: Right frontal scalp hematoma.  No skull fracture. Sinuses/Orbits: No fluid levels or advanced mucosal thickening of the visualized paranasal sinuses. No mastoid or middle ear effusion. The orbits are normal. CT CERVICAL SPINE FINDINGS Alignment: No static subluxation. Facets are aligned. Occipital condyles are normally positioned. Skull base and vertebrae: No acute fracture. Sclerotic lesion at C2 is likely a bone island. Soft tissues and spinal canal: No prevertebral fluid or swelling. No visible canal hematoma. Disc levels: Multilevel facet hypertrophy worst at C3-4 and C4-5. No bony spinal canal stenosis. Upper chest: No pneumothorax, pulmonary nodule or pleural effusion. Other: Normal visualized paraspinal cervical soft tissues. IMPRESSION: 1. Chronic ischemic microangiopathy and generalized volume loss without acute intracranial abnormality. 2. Multilevel cervical degenerative disc and facet disease without acute fracture or static subluxation. Electronically Signed   By: Ulyses Jarred M.D.   On: 01/10/2019 03:20   Dg Hip Unilat W Or Wo Pelvis 2-3 Views Right  Result Date: 01/10/2019 CLINICAL DATA:  84 year old  male with fall and right hip pain. EXAM: DG HIP (WITH OR WITHOUT PELVIS) 2-3V RIGHT COMPARISON:  None. FINDINGS: There is no acute fracture or dislocation. The bones are osteopenic. Advanced bilateral hip osteoarthritic changes noted. Left femoral intramedullary rod and transcervical screws. Degenerative changes of the visualized lower lumbar spine. Surgical clips and mesh noted in the pelvis. The soft tissues are grossly unremarkable. IMPRESSION: No acute fracture or dislocation. Electronically Signed   By: Anner Crete M.D.   On: 01/10/2019 03:12    Procedures .Marland KitchenLaceration Repair Date/Time: 01/10/2019 4:27 AM Performed by: Varney Biles, MD Authorized by: Varney Biles, MD   Consent:    Consent obtained:  Verbal   Consent given by:  Patient   Risks discussed:  Infection, pain, poor cosmetic result, poor wound healing and need for additional repair   Alternatives discussed:  No treatment Anesthesia (see MAR for exact dosages):    Anesthesia method:  Local infiltration   Local anesthetic:  Lidocaine 2% w/o epi Laceration details:    Location:  Face   Face location:  R eyebrow   Length (cm):  5   Depth (mm):  5 Repair type:    Repair type:  Complex Pre-procedure details:    Preparation:  Patient was prepped and draped in usual sterile fashion Exploration:    Limited defect created (wound extended): no     Hemostasis achieved with:  Direct pressure   Wound exploration: wound explored through full range of motion and entire depth of wound probed and visualized     Wound extent: fascia violated     Wound extent: no muscle damage noted and no nerve damage noted     Contaminated: no   Treatment:    Area cleansed with:  Saline   Amount of cleaning:  Extensive   Irrigation solution:  Tap water   Irrigation volume:  50   Irrigation method:  Syringe   Visualized foreign bodies/material removed: yes     Debridement:  None   Undermining:  None   Scar revision: no   Skin repair:      Repair method:  Sutures   Suture size:  4-0   Wound skin closure material used: Vicryl Rapide.   Suture technique:  Simple interrupted   Number of sutures:  8 Approximation:    Approximation:  Close Post-procedure details:    Dressing:  Antibiotic ointment   Patient tolerance of procedure:  Tolerated well, no immediate complications Comments:     The laceration was irregular, but we were able to approximated quite well. There was fascial tear as well at 1 area of the laceration medially, we decided against any internal repair, opting instead to ensure that suturing was deep in that area and included the fascial plane when fixing it.   (including critical care time)  Medications Ordered in ED Medications  lidocaine-EPINEPHrine (XYLOCAINE W/EPI) 2 %-1:200000 (PF) injection 20 mL (has no administration in time range)  lidocaine (XYLOCAINE) 2 % (with pres) injection (has no administration in time range)  bacitracin ointment (has no administration in time range)  acetaminophen (TYLENOL) tablet 650 mg (has no administration in time range)     Initial Impression / Assessment and Plan / ED Course  I have reviewed the triage vital signs and the nursing notes.  Pertinent labs & imaging results that were available during my care of the patient were reviewed by me and considered in my medical decision making (see chart for details).       DDx includes: - Mechanical falls - ICH - Fractures - Contusions - Soft tissue injury  84 year old comes in a chief complaint of fall. Patient had a mechanical fall earlier today that resulted in irregular laceration to the right side of the forehead.  Appropriate imaging was ordered based on patient's symptoms, and the results are all negative for any acute fracture or internal brain bleed.  Laceration was repaired.   Final Clinical Impressions(s) / ED Diagnoses   Final diagnoses:  Forehead laceration, initial encounter  Multiple  contusions    ED Discharge Orders         Ordered    bacitracin ointment  2 times daily     01/10/19 0421           Varney Biles, MD 01/10/19 [phone]

## 2019-04-01 ENCOUNTER — Emergency Department (HOSPITAL_COMMUNITY): Payer: Medicare HMO

## 2019-04-01 ENCOUNTER — Other Ambulatory Visit: Payer: Self-pay

## 2019-04-01 ENCOUNTER — Emergency Department (HOSPITAL_COMMUNITY)
Admission: EM | Admit: 2019-04-01 | Discharge: 2019-04-01 | Disposition: A | Discharge status: Home / Self Care | Payer: Medicare HMO | Attending: Emergency Medicine | Admitting: Emergency Medicine

## 2019-04-01 ENCOUNTER — Encounter (HOSPITAL_COMMUNITY): Payer: Self-pay | Admitting: Emergency Medicine

## 2019-04-01 DIAGNOSIS — I10 Essential (primary) hypertension: Secondary | ICD-10-CM | POA: Diagnosis not present

## 2019-04-01 DIAGNOSIS — I2510 Atherosclerotic heart disease of native coronary artery without angina pectoris: Secondary | ICD-10-CM | POA: Diagnosis not present

## 2019-04-01 DIAGNOSIS — E039 Hypothyroidism, unspecified: Secondary | ICD-10-CM | POA: Insufficient documentation

## 2019-04-01 DIAGNOSIS — Z951 Presence of aortocoronary bypass graft: Secondary | ICD-10-CM | POA: Diagnosis not present

## 2019-04-01 DIAGNOSIS — Z7902 Long term (current) use of antithrombotics/antiplatelets: Secondary | ICD-10-CM | POA: Insufficient documentation

## 2019-04-01 DIAGNOSIS — S161XXA Strain of muscle, fascia and tendon at neck level, initial encounter: Secondary | ICD-10-CM

## 2019-04-01 DIAGNOSIS — Y92129 Unspecified place in nursing home as the place of occurrence of the external cause: Secondary | ICD-10-CM | POA: Insufficient documentation

## 2019-04-01 DIAGNOSIS — S0990XA Unspecified injury of head, initial encounter: Secondary | ICD-10-CM | POA: Diagnosis not present

## 2019-04-01 DIAGNOSIS — Z79899 Other long term (current) drug therapy: Secondary | ICD-10-CM | POA: Insufficient documentation

## 2019-04-01 DIAGNOSIS — Y999 Unspecified external cause status: Secondary | ICD-10-CM | POA: Diagnosis not present

## 2019-04-01 DIAGNOSIS — W51XXXA Accidental striking against or bumped into by another person, initial encounter: Secondary | ICD-10-CM | POA: Insufficient documentation

## 2019-04-01 DIAGNOSIS — Z7982 Long term (current) use of aspirin: Secondary | ICD-10-CM | POA: Diagnosis not present

## 2019-04-01 DIAGNOSIS — Y9389 Activity, other specified: Secondary | ICD-10-CM | POA: Diagnosis not present

## 2019-04-01 DIAGNOSIS — F0390 Unspecified dementia without behavioral disturbance: Secondary | ICD-10-CM | POA: Diagnosis not present
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
## 2019-04-01 NOTE — Discharge Instructions (Addendum)
Follow-up with your primary doctor if you experience any new and/or concerning symptoms.  Continue medications as previously prescribed.

## 2019-04-01 NOTE — ED Notes (Signed)
He is awake and in no distress.

## 2019-04-24 ENCOUNTER — Other Ambulatory Visit: Payer: Self-pay

## 2019-04-24 ENCOUNTER — Emergency Department (HOSPITAL_COMMUNITY): Payer: Medicare HMO

## 2019-04-24 ENCOUNTER — Emergency Department (HOSPITAL_COMMUNITY)
Admission: EM | Admit: 2019-04-24 | Discharge: 2019-04-24 | Disposition: A | Discharge status: Home / Self Care | Payer: Medicare HMO | Attending: Emergency Medicine | Admitting: Emergency Medicine

## 2019-04-24 DIAGNOSIS — I10 Essential (primary) hypertension: Secondary | ICD-10-CM | POA: Diagnosis not present

## 2019-04-24 DIAGNOSIS — Z79899 Other long term (current) drug therapy: Secondary | ICD-10-CM | POA: Insufficient documentation

## 2019-04-24 DIAGNOSIS — W010XXA Fall on same level from slipping, tripping and stumbling without subsequent striking against object, initial encounter: Secondary | ICD-10-CM | POA: Diagnosis not present

## 2019-04-24 DIAGNOSIS — Y92121 Bathroom in nursing home as the place of occurrence of the external cause: Secondary | ICD-10-CM | POA: Insufficient documentation

## 2019-04-24 DIAGNOSIS — E039 Hypothyroidism, unspecified: Secondary | ICD-10-CM | POA: Diagnosis not present

## 2019-04-24 DIAGNOSIS — I2510 Atherosclerotic heart disease of native coronary artery without angina pectoris: Secondary | ICD-10-CM | POA: Diagnosis not present

## 2019-04-24 DIAGNOSIS — Z7902 Long term (current) use of antithrombotics/antiplatelets: Secondary | ICD-10-CM | POA: Insufficient documentation

## 2019-04-24 DIAGNOSIS — Z7982 Long term (current) use of aspirin: Secondary | ICD-10-CM | POA: Diagnosis not present

## 2019-04-24 DIAGNOSIS — F0390 Unspecified dementia without behavioral disturbance: Secondary | ICD-10-CM | POA: Insufficient documentation

## 2019-04-24 DIAGNOSIS — Z23 Encounter for immunization: Secondary | ICD-10-CM | POA: Insufficient documentation

## 2019-04-24 DIAGNOSIS — S0181XA Laceration without foreign body of other part of head, initial encounter: Secondary | ICD-10-CM | POA: Diagnosis not present

## 2019-04-24 DIAGNOSIS — S0990XA Unspecified injury of head, initial encounter: Secondary | ICD-10-CM | POA: Diagnosis present

## 2019-04-24 DIAGNOSIS — Y939 Activity, unspecified: Secondary | ICD-10-CM | POA: Diagnosis not present

## 2019-04-24 DIAGNOSIS — Y999 Unspecified external cause status: Secondary | ICD-10-CM | POA: Diagnosis not present

## 2019-04-24 DIAGNOSIS — W19XXXA Unspecified fall, initial encounter: Secondary | ICD-10-CM

## 2019-04-24 MED ORDER — BACITRACIN ZINC 500 UNIT/GM EX OINT
TOPICAL_OINTMENT | CUTANEOUS | Status: AC
  Administered 2019-04-24: 1
  Filled 2019-04-24: qty 1.8

## 2019-04-24 MED ORDER — TETANUS-DIPHTH-ACELL PERTUSSIS 5-2.5-18.5 LF-MCG/0.5 IM SUSP
0.5000 mL | Freq: Once | INTRAMUSCULAR | Status: AC
  Administered 2019-04-24: 07:00:00 0.5 mL via INTRAMUSCULAR
  Filled 2019-04-24: qty 0.5

## 2019-04-24 MED ORDER — LIDOCAINE-EPINEPHRINE (PF) 2 %-1:200000 IJ SOLN
10.0000 mL | Freq: Once | INTRAMUSCULAR | Status: AC
  Administered 2019-04-24: 10 mL
  Filled 2019-04-24: qty 10

## 2019-04-24 MED ORDER — DOUBLE ANTIBIOTIC 500-10000 UNIT/GM EX OINT
TOPICAL_OINTMENT | Freq: Once | CUTANEOUS | Status: DC
  Filled 2019-04-24: qty 28.4

## 2019-04-24 NOTE — ED Notes (Signed)
Pt able to stand with 1 person assist.

## 2019-04-24 NOTE — ED Provider Notes (Signed)
Patient care assumed at  0700. Patient from nursing facility for evaluation following a fall. He has a laceration to the face - repaired per PA note.    D/w pt's wife, Jermaine Castaneda - plan to d/c back to facility with outpatient follow up.    Quintella Reichert, MD 04/24/19 [phone]

## 2019-04-24 NOTE — ED Provider Notes (Signed)
..  Laceration Repair  Date/Time: 04/24/2019 8:17 AM Performed by: Eustaquio Maize, PA-C Authorized by: Eustaquio Maize, PA-C   Consent:    Consent obtained:  Verbal   Consent given by:  Patient   Risks discussed:  Infection, pain and poor cosmetic result Anesthesia (see MAR for exact dosages):    Anesthesia method:  Local infiltration   Local anesthetic:  Lidocaine 2% WITH epi Laceration details:    Location:  Face   Face location:  Forehead   Length (cm):  5   Depth (mm):  3 Repair type:    Repair type:  Complex Pre-procedure details:    Preparation:  Patient was prepped and draped in usual sterile fashion Exploration:    Hemostasis achieved with:  Epinephrine   Wound exploration: wound explored through full range of motion   Treatment:    Area cleansed with:  Betadine   Irrigation solution:  Sterile saline Skin repair:    Repair method:  Sutures   Suture size:  6-0   Suture material:  Nylon   Suture technique:  Simple interrupted   Number of sutures:  10 (with steri strips over superficial portion) Approximation:    Approximation:  Close Post-procedure details:    Dressing:  Non-adherent dressing   Patient tolerance of procedure:  Tolerated well, no immediate complications        Eustaquio Maize, PA-C 04/24/19 [id number]    Quintella Reichert, MD 04/25/19 [phone]

## 2019-04-24 NOTE — ED Notes (Signed)
[facility] called for transport.  

## 2019-04-24 NOTE — ED Provider Notes (Signed)
[facility] DEPT Provider Note   CSN: [id number] Arrival date & time: 04/24/19  [id number]    History   Chief Complaint Fall  HPI Jermaine Castaneda is a 84 y.o. male.   The history is provided by the EMS personnel. The history is limited by the condition of the patient (Dementia).  He has history of hypertension, hyperlipidemia, coronary artery disease, hypothyroidism, dementia and comes in following a fall at home.  He apparently fell in the bathroom and suffered a facial laceration.  There is unknown loss of consciousness.  Last tetanus ministration is unknown.  Past Medical History:  Diagnosis Date  . CAD (coronary artery disease)   . Hyperlipidemia   . Hypertension     Patient Active Problem List   Diagnosis Date Noted  . Cerebrovascular disease 10/11/2013  . Hemorrhoids, external with complications [id number]  . DUMPING SYNDROME 03/16/2010  . MALIGNANT NEOPLASM OF COLON UNSPECIFIED SITE 02/11/2010  . COLONIC POLYPS 12/11/2009  . ACTINIC KERATOSIS 07/15/2009  . LOC OSTEOARTHROS NOT SPEC PRIM/SEC LOWER LEG 10/02/2007  . HYPOTHYROIDISM 09/26/2007  . ANEMIA 09/26/2007  . PSA, INCREASED 09/26/2007  . HYPERLIPIDEMIA 04/27/2007  . HYPERTENSION 04/27/2007  . CORONARY ARTERY DISEASE 04/27/2007    Past Surgical History:  Procedure Laterality Date  . CORONARY ARTERY BYPASS GRAFT    . CORONARY ARTERY BYPASS GRAFT  1996  . TOTAL KNEE ARTHROPLASTY  2003        Home Medications    Prior to Admission medications   Medication Sig Start Date End Date Taking? Authorizing Provider  amlodipine-atorvastatin (CADUET) 10-20 MG per tablet TAKE 1 TABLET BY MOUTH DAILY. 06/19/14   Ricard Dillon, MD  aspirin EC 81 MG tablet Take 1 tablet (81 mg total) by mouth daily. 10/11/13   Marletta Lor, MD  bacitracin ointment Apply 1 application topically 2 (two) times daily. 01/10/19   Varney Biles, MD  BENICAR 40 MG tablet TAKE 1 TABLET EVERY DAY 09/24/13    Ricard Dillon, MD  clopidogrel (PLAVIX) 75 MG tablet TAKE 1 TABLET BY MOUTH EVERY DAY WITH BREAKFAST 04/08/14   Ricard Dillon, MD  hydrocortisone (ANUSOL-HC) 2.5 % rectal cream Place 1 application rectally as needed for Hemorrhoids.    [provider]  hydrocortisone cream 1 % Apply 1 application topically 2 (two) times daily. OTC    [provider]  metoprolol (LOPRESSOR) 50 MG tablet TAKE 1 TABLET TWICE A DAY 05/07/14   Ricard Dillon, MD  sertraline (ZOLOFT) 25 MG tablet TAKE 1 TABLET BY MOUTH EVERY DAY 01/08/14   Ricard Dillon, MD    Family History Family History  Problem Relation Age of Onset  . Heart disease Mother   . Heart disease Father   . Hyperlipidemia Father   . Hypertension Father   . Coronary artery disease Other     Social History Social History   Tobacco Use  . Smoking status: Never Smoker  . Smokeless tobacco: Never Used  Substance Use Topics  . Alcohol use: Yes  . Drug use: No     Allergies   Patient has no known allergies.   Review of Systems Review of Systems  Unable to perform ROS: Dementia     Physical Exam Updated Vital Signs BP (!) 178/81 (BP Location: Right Arm)   Pulse 67   Temp 98.6 F (37 C) (Oral)   Resp 18   Wt 110 kg   SpO2 100%   BMI 32.89 kg/m  Physical Exam Vitals signs and nursing note reviewed.    84 year old male, resting comfortably and in no acute distress. Vital signs are significant for elevated blood pressure. Oxygen saturation is 100%, which is normal. Head is normocephalic.  Complex laceration present right side of forehead involving the eyebrow and forehead. PERRLA, EOMI. Oropharynx is clear. Neck is immobilized in a stiff cervical collar and is nontender without adenopathy or JVD. Back is nontender and there is no CVA tenderness. Lungs are clear without rales, wheezes, or rhonchi. Chest is nontender. Heart has regular rate and rhythm without murmur. Abdomen is soft, flat, nontender  without masses or hepatosplenomegaly and peristalsis is normoactive. Extremities have no cyanosis or edema, full range of motion is present. Skin is warm and dry without rash. Neurologic: Awake and alert, oriented to person and place but not time, cranial nerves are intact, there are no motor or sensory deficits.  ED Treatments / Results   Radiology Ct Head Wo Contrast  Result Date: 04/24/2019 CLINICAL DATA:  Blunt maxillofacial trauma EXAM: CT HEAD WITHOUT CONTRAST CT CERVICAL SPINE WITHOUT CONTRAST TECHNIQUE: Multidetector CT imaging of the head and cervical spine was performed following the standard protocol without intravenous contrast. Multiplanar CT image reconstructions of the cervical spine were also generated. COMPARISON:  04/01/2019 FINDINGS: CT HEAD FINDINGS Brain: No evidence of acute infarction, hemorrhage, hydrocephalus, extra-axial collection or mass lesion/mass effect. Generalized atrophy with mild for age chronic small vessel ischemia. Vascular: Atherosclerotic calcification Skull: Right supraorbital contusion/laceration without calvarial fracture. Sinuses/Orbits: Negative CT CERVICAL SPINE FINDINGS Alignment: Mild dextrocurvature. Mild C2-3 retrolisthesis. No traumatic malalignment Skull base and vertebrae: Negative for fracture. Sclerotic focus in the C2 body likely reflecting bone island in isolation. No provided history of malignancy. Soft tissues and spinal canal: No prevertebral fluid or swelling. No visible canal hematoma. Disc levels:  Degenerative facet spurring asymmetric to the left. Upper chest: Negative IMPRESSION: 1. No evidence of acute intracranial or cervical spine injury. 2. Right supraorbital scalp laceration without fracture. Electronically Signed   By: Monte Fantasia M.D.   On: 04/24/2019 06:20   Ct Cervical Spine Wo Contrast  Result Date: 04/24/2019 CLINICAL DATA:  Blunt maxillofacial trauma EXAM: CT HEAD WITHOUT CONTRAST CT CERVICAL SPINE WITHOUT CONTRAST  TECHNIQUE: Multidetector CT imaging of the head and cervical spine was performed following the standard protocol without intravenous contrast. Multiplanar CT image reconstructions of the cervical spine were also generated. COMPARISON:  04/01/2019 FINDINGS: CT HEAD FINDINGS Brain: No evidence of acute infarction, hemorrhage, hydrocephalus, extra-axial collection or mass lesion/mass effect. Generalized atrophy with mild for age chronic small vessel ischemia. Vascular: Atherosclerotic calcification Skull: Right supraorbital contusion/laceration without calvarial fracture. Sinuses/Orbits: Negative CT CERVICAL SPINE FINDINGS Alignment: Mild dextrocurvature. Mild C2-3 retrolisthesis. No traumatic malalignment Skull base and vertebrae: Negative for fracture. Sclerotic focus in the C2 body likely reflecting bone island in isolation. No provided history of malignancy. Soft tissues and spinal canal: No prevertebral fluid or swelling. No visible canal hematoma. Disc levels:  Degenerative facet spurring asymmetric to the left. Upper chest: Negative IMPRESSION: 1. No evidence of acute intracranial or cervical spine injury. 2. Right supraorbital scalp laceration without fracture. Electronically Signed   By: Monte Fantasia M.D.   On: 04/24/2019 06:20    Procedures Procedures  Medications Ordered in ED Medications  lidocaine-EPINEPHrine (XYLOCAINE W/EPI) 2 %-1:200000 (PF) injection 10 mL (has no administration in time range)  Tdap (BOOSTRIX) injection 0.5 mL (has no administration in time range)     Initial Impression /  Assessment and Plan / ED Course  I have reviewed the triage vital signs and the nursing notes.  Pertinent imaging results that were available during my care of the patient were reviewed by me and considered in my medical decision making (see chart for details).  Fall at home with facial laceration.  Old records are reviewed, and he does have prior ED visit for falls.  No record of last tetanus  immunization.  He will be sent for CT of head and cervical spine.  Tdap booster is given.  Laceration will need suture repair.  CT scan shows no evidence of acute injury.  Laceration is closed by Eustaquio Maize PA-C.  Final Clinical Impressions(s) / ED Diagnoses   Final diagnoses:  Fall at nursing home, initial encounter  Forehead laceration, initial encounter  Elevated blood pressure reading with diagnosis of hypertension    ED Discharge Orders    None       Delora Fuel, MD [id number] [phone]

## 2019-05-14 ENCOUNTER — Emergency Department (HOSPITAL_COMMUNITY)
Admission: EM | Admit: 2019-05-14 | Discharge: 2019-05-14 | Disposition: A | Discharge status: Home / Self Care | Payer: Medicare HMO | Attending: Emergency Medicine | Admitting: Emergency Medicine

## 2019-05-14 ENCOUNTER — Encounter (HOSPITAL_COMMUNITY): Payer: Self-pay

## 2019-05-14 ENCOUNTER — Emergency Department (HOSPITAL_COMMUNITY): Payer: Medicare HMO

## 2019-05-14 DIAGNOSIS — I2510 Atherosclerotic heart disease of native coronary artery without angina pectoris: Secondary | ICD-10-CM | POA: Insufficient documentation

## 2019-05-14 DIAGNOSIS — Z79899 Other long term (current) drug therapy: Secondary | ICD-10-CM | POA: Insufficient documentation

## 2019-05-14 DIAGNOSIS — S01111D Laceration without foreign body of right eyelid and periocular area, subsequent encounter: Secondary | ICD-10-CM | POA: Diagnosis not present

## 2019-05-14 DIAGNOSIS — Z951 Presence of aortocoronary bypass graft: Secondary | ICD-10-CM | POA: Diagnosis not present

## 2019-05-14 DIAGNOSIS — R51 Headache: Secondary | ICD-10-CM | POA: Insufficient documentation

## 2019-05-14 DIAGNOSIS — X58XXXD Exposure to other specified factors, subsequent encounter: Secondary | ICD-10-CM | POA: Diagnosis not present

## 2019-05-14 DIAGNOSIS — I10 Essential (primary) hypertension: Secondary | ICD-10-CM | POA: Diagnosis not present

## 2019-05-14 DIAGNOSIS — W19XXXA Unspecified fall, initial encounter: Secondary | ICD-10-CM

## 2019-05-14 NOTE — ED Provider Notes (Signed)
[facility] DEPT Provider Note   CSN: [id number] Arrival date & time: 05/14/19  0807     History   Chief Complaint Chief Complaint  Patient presents with   Fall   Headache    HPI Jermaine Castaneda is a 84 y.o. male.     The history is provided by the patient.  Fall This is a new problem. The current episode started 1 to 2 hours ago. The problem occurs rarely. The problem has been resolved. Nothing aggravates the symptoms. Nothing relieves the symptoms. He has tried nothing for the symptoms. The treatment provided no relief.    Past Medical History:  Diagnosis Date   CAD (coronary artery disease)    Hyperlipidemia    Hypertension     Patient Active Problem List   Diagnosis Date Noted   Cerebrovascular disease 10/11/2013   Hemorrhoids, external with complications [id number]   DUMPING SYNDROME 03/16/2010   MALIGNANT NEOPLASM OF COLON UNSPECIFIED SITE 02/11/2010   COLONIC POLYPS 12/11/2009   ACTINIC KERATOSIS 07/15/2009   LOC OSTEOARTHROS NOT SPEC PRIM/SEC LOWER LEG 10/02/2007   HYPOTHYROIDISM 09/26/2007   ANEMIA 09/26/2007   PSA, INCREASED 09/26/2007   HYPERLIPIDEMIA 04/27/2007   HYPERTENSION 04/27/2007   CORONARY ARTERY DISEASE 04/27/2007    Past Surgical History:  Procedure Laterality Date   CORONARY ARTERY BYPASS GRAFT     CORONARY ARTERY BYPASS GRAFT  1996   TOTAL KNEE ARTHROPLASTY  2003        Home Medications    Prior to Admission medications   Medication Sig Start Date End Date Taking? Authorizing Provider  acetaminophen (TYLENOL) 325 MG tablet Take 650 mg by mouth every 6 (six) hours as needed for mild pain.   Yes [provider]  atorvastatin (LIPITOR) 20 MG tablet Take 20 mg by mouth daily at 6 PM.   Yes [provider]  busPIRone (BUSPAR) 10 MG tablet Take 10 mg by mouth 3 (three) times daily as needed for anxiety or agitation. 02/08/19  Yes [provider]  busPIRone  (BUSPAR) 5 MG tablet Take 5 mg by mouth 2 (two) times daily.   Yes [provider]  divalproex (DEPAKOTE) 250 MG DR tablet Take 250 mg by mouth 2 (two) times daily.   Yes [provider]  donepezil (ARICEPT) 10 MG tablet Take 10 mg by mouth at bedtime.   Yes [provider]  loperamide (IMODIUM) 2 MG capsule Take 2 mg by mouth See admin instructions. Take 1 capsule by mouth after each loose stool. Max 16mg /day. 03/22/19  Yes [provider]  losartan (COZAAR) 100 MG tablet Take 100 mg by mouth daily.   Yes [provider]  memantine (NAMENDA) 10 MG tablet Take 10 mg by mouth 2 (two) times daily.   Yes [provider]  metoprolol tartrate (LOPRESSOR) 25 MG tablet Take 75 mg by mouth 2 (two) times daily.   Yes [provider]  sertraline (ZOLOFT) 25 MG tablet TAKE 1 TABLET BY MOUTH EVERY DAY Patient taking differently: Take 50 mg by mouth daily.  01/08/14  Yes Ricard Dillon, MD  Vitamin D, Ergocalciferol, (DRISDOL) 1.25 MG (50000 UT) CAPS capsule Take 50,000 Units by mouth daily.   Yes [provider]  Zinc Oxide (DESITIN) 13 % CREA Apply 1 application topically daily. Apply on bottom one time a day and after diaper change until healed.   Yes [provider]  amlodipine-atorvastatin (CADUET) 10-20 MG per tablet TAKE 1 TABLET BY  MOUTH DAILY. Patient not taking: Reported on 05/14/2019 06/19/14   Ricard Dillon, MD  aspirin EC 81 MG tablet Take 1 tablet (81 mg total) by mouth daily. Patient not taking: Reported on 05/14/2019 10/11/13   Marletta Lor, MD  bacitracin ointment Apply 1 application topically 2 (two) times daily. Patient not taking: Reported on 05/14/2019 01/10/19   Varney Biles, MD  BENICAR 40 MG tablet TAKE 1 TABLET EVERY DAY Patient not taking: Reported on 05/14/2019 09/24/13   Ricard Dillon, MD  clopidogrel (PLAVIX) 75 MG tablet TAKE 1 TABLET BY MOUTH EVERY DAY WITH BREAKFAST Patient not taking:  Reported on 05/14/2019 04/08/14   Ricard Dillon, MD  metoprolol (LOPRESSOR) 50 MG tablet TAKE 1 TABLET TWICE A DAY Patient not taking: Reported on 05/14/2019 05/07/14   Ricard Dillon, MD    Family History Family History  Problem Relation Age of Onset   Heart disease Mother    Heart disease Father    Hyperlipidemia Father    Hypertension Father    Coronary artery disease Other     Social History Social History   Tobacco Use   Smoking status: Never Smoker   Smokeless tobacco: Never Used  Substance Use Topics   Alcohol use: Yes   Drug use: No     Allergies   Patient has no known allergies.   Review of Systems Review of Systems  Unable to perform ROS: Dementia     Physical Exam Updated Vital Signs  ED Triage Vitals  Enc Vitals Group     BP 05/14/19 0820 138/65     Pulse Rate 05/14/19 0820 (!) 50     Resp 05/14/19 0820 16     Temp 05/14/19 0821 98.4 F (36.9 C)     Temp Source 05/14/19 0821 Oral     SpO2 05/14/19 0820 100 %     Weight --      Height --      Head Circumference --      Peak Flow --      Pain Score --      Pain Loc --      Pain Edu? --      Excl. in [place]? --     Physical Exam Vitals signs and nursing note reviewed.  Constitutional:      General: He is not in acute distress.    Appearance: He is well-developed. He is not ill-appearing.  HENT:     Head: Normocephalic.     Comments: Well-healing wound to the right eyebrow    Mouth/Throat:     Mouth: Mucous membranes are moist.     Pharynx: Oropharynx is clear.  Eyes:     Extraocular Movements: Extraocular movements intact.     Conjunctiva/sclera: Conjunctivae normal.  Neck:     Musculoskeletal: Normal range of motion and neck supple.  Cardiovascular:     Rate and Rhythm: Normal rate and regular rhythm.     Heart sounds: Normal heart sounds. No murmur.  Pulmonary:     Effort: Pulmonary effort is normal. No respiratory distress.     Breath sounds: Normal breath sounds.    Abdominal:     Palpations: Abdomen is soft.     Tenderness: There is no abdominal tenderness.  Skin:    General: Skin is warm and dry.  Neurological:     Mental Status: He is alert. Mental status is at baseline.     Cranial Nerves: No cranial nerve deficit.  Motor: No weakness.     Comments: Pleasant, able to answer yes/no questions      ED Treatments / Results  Labs (all labs ordered are listed, but only abnormal results are displayed) Labs Reviewed - No data to display  EKG None  Radiology Ct Head Wo Contrast  Result Date: 05/14/2019 CLINICAL DATA:  Fall out of bed EXAM: CT HEAD WITHOUT CONTRAST CT CERVICAL SPINE WITHOUT CONTRAST TECHNIQUE: Multidetector CT imaging of the head and cervical spine was performed following the standard protocol without intravenous contrast. Multiplanar CT image reconstructions of the cervical spine were also generated. COMPARISON:  04/24/2019 FINDINGS: CT HEAD FINDINGS Brain: No evidence of acute infarction, hemorrhage, hydrocephalus, extra-axial collection or mass lesion/mass effect. Periventricular white matter hypodensity and global volume loss. Mega cisterna magna variant of the posterior fossa. Vascular: No hyperdense vessel or unexpected calcification. Skull: Normal. Negative for fracture or focal lesion. Sinuses/Orbits: No acute finding. Other: None. CT CERVICAL SPINE FINDINGS Alignment: Normal. Skull base and vertebrae: No acute fracture. Unchanged 1.2 cm sclerotic lesion of the left aspect of the body of C2 (series 5, image 21). Soft tissues and spinal canal: No prevertebral fluid or swelling. No visible canal hematoma. Disc levels: Moderate multilevel disc and facet degenerative disease. Upper chest: Negative. Other: None. IMPRESSION: 1. No acute intracranial pathology. Small-vessel white matter disease and global volume loss. 2.  No fracture or static subluxation of the cervical spine. 3. Unchanged 1.2 cm sclerotic lesion of the left aspect of  the body of C2, likely an incidental bone island. Electronically Signed   By: Eddie Candle M.D.   On: 05/14/2019 09:56   Ct Cervical Spine Wo Contrast  Result Date: 05/14/2019 CLINICAL DATA:  Fall out of bed EXAM: CT HEAD WITHOUT CONTRAST CT CERVICAL SPINE WITHOUT CONTRAST TECHNIQUE: Multidetector CT imaging of the head and cervical spine was performed following the standard protocol without intravenous contrast. Multiplanar CT image reconstructions of the cervical spine were also generated. COMPARISON:  04/24/2019 FINDINGS: CT HEAD FINDINGS Brain: No evidence of acute infarction, hemorrhage, hydrocephalus, extra-axial collection or mass lesion/mass effect. Periventricular white matter hypodensity and global volume loss. Mega cisterna magna variant of the posterior fossa. Vascular: No hyperdense vessel or unexpected calcification. Skull: Normal. Negative for fracture or focal lesion. Sinuses/Orbits: No acute finding. Other: None. CT CERVICAL SPINE FINDINGS Alignment: Normal. Skull base and vertebrae: No acute fracture. Unchanged 1.2 cm sclerotic lesion of the left aspect of the body of C2 (series 5, image 21). Soft tissues and spinal canal: No prevertebral fluid or swelling. No visible canal hematoma. Disc levels: Moderate multilevel disc and facet degenerative disease. Upper chest: Negative. Other: None. IMPRESSION: 1. No acute intracranial pathology. Small-vessel white matter disease and global volume loss. 2.  No fracture or static subluxation of the cervical spine. 3. Unchanged 1.2 cm sclerotic lesion of the left aspect of the body of C2, likely an incidental bone island. Electronically Signed   By: Eddie Candle M.D.   On: 05/14/2019 09:56    Procedures .Suture Removal  Date/Time: 05/14/2019 8:52 AM Performed by: Lennice Sites, DO Authorized by: Lennice Sites, DO   Consent:    Consent obtained:  Verbal   Consent given by:  Patient   Risks discussed:  Bleeding, pain and wound separation    Alternatives discussed:  No treatment Location:    Location:  [place] location:  [place]   Ey[place] location:  R eyebrow Procedure details:    Wound appearance:  No signs  of infection, good wound healing and clean   Number of sutures removed:  10 Post-procedure details:    Post-removal:  Antibiotic ointment applied   Patient tolerance of procedure:  Tolerated well, no immediate complications   (including critical care time)  Medications Ordered in ED Medications - No data to display   Initial Impression / Assessment and Plan / ED Course  I have reviewed the triage vital signs and the nursing notes.  Pertinent labs & imaging results that were available during my care of the patient were reviewed by me and considered in my medical decision making (see chart for details).     DANIELE LAFAUCI is an 84 year old male with history of CAD, high cholesterol, hypertension who presents to the ED after fall.  Patient with history of dementia as well.  Had unwitnessed fall at memory unit at nursing home.  No signs of external trauma.  Had a recent fall and had a laceration to the right eyebrow.  Sutures were removed as were placed over 2 weeks ago.  Wound overall appears well-healed.  CT scan of his head and neck today were unremarkable.  Had no chest pain, no hip pain.  No other signs of trauma on exam.  It appeared that he likely had minor fall.  Neurologically patient is at his baseline.  Moves all extremities.  Pleasantly confused.  Was discharged back to facility.  This chart was dictated using voice recognition software.  Despite best efforts to proofread,  errors can occur which can change the documentation meaning.    Final Clinical Impressions(s) / ED Diagnoses   Final diagnoses:  Fall, initial encounter    ED Discharge Orders    None       Lennice Sites, DO 05/14/19 1105

## 2019-05-14 NOTE — ED Notes (Signed)
Transported to CT 

## 2019-05-14 NOTE — ED Notes (Signed)
[facility] has been dispatched.  

## 2019-05-14 NOTE — ED Notes (Signed)
[facility] at bedside 

## 2019-05-14 NOTE — ED Notes (Signed)
[place],MD at bedside.  

## 2019-05-14 NOTE — ED Triage Notes (Signed)
Patient arrived via [facility] from [facility] (memory care unit)  Nursing staff found patient sitting beside his bed on the grown.   Patient reports "sliding" out of bed. Patient reports hitting his head on the right side.  C/O headache.   Denies blood thinners. Denies LOC.   Per ems patient moans and groans even if he is not in pain.   Per nurse facility staff patient is acting like his normal.    146/78 p-66 96.8  Patient likes to be called Jermaine Castaneda. Patient answers questions with a yes or no.

## 2019-05-14 NOTE — Discharge Instructions (Signed)
No injuries found on today's imaging.  Sutures have been removed from the right eyebrow.  Continue basic wound care.

## 2019-05-18 ENCOUNTER — Encounter (HOSPITAL_COMMUNITY): Payer: Self-pay | Admitting: Emergency Medicine

## 2019-05-18 ENCOUNTER — Emergency Department (HOSPITAL_COMMUNITY)
Admission: EM | Admit: 2019-05-18 | Discharge: 2019-05-18 | Disposition: A | Discharge status: Home / Self Care | Payer: Medicare HMO | Attending: Emergency Medicine | Admitting: Emergency Medicine

## 2019-05-18 ENCOUNTER — Other Ambulatory Visit: Payer: Self-pay

## 2019-05-18 DIAGNOSIS — E039 Hypothyroidism, unspecified: Secondary | ICD-10-CM | POA: Diagnosis not present

## 2019-05-18 DIAGNOSIS — Z79899 Other long term (current) drug therapy: Secondary | ICD-10-CM | POA: Insufficient documentation

## 2019-05-18 DIAGNOSIS — R296 Repeated falls: Secondary | ICD-10-CM | POA: Insufficient documentation

## 2019-05-18 DIAGNOSIS — Z043 Encounter for examination and observation following other accident: Secondary | ICD-10-CM | POA: Insufficient documentation

## 2019-05-18 DIAGNOSIS — Z7902 Long term (current) use of antithrombotics/antiplatelets: Secondary | ICD-10-CM | POA: Diagnosis not present

## 2019-05-18 DIAGNOSIS — Z951 Presence of aortocoronary bypass graft: Secondary | ICD-10-CM | POA: Insufficient documentation

## 2019-05-18 DIAGNOSIS — W19XXXA Unspecified fall, initial encounter: Secondary | ICD-10-CM

## 2019-05-18 DIAGNOSIS — I10 Essential (primary) hypertension: Secondary | ICD-10-CM | POA: Insufficient documentation

## 2019-05-18 DIAGNOSIS — Z96659 Presence of unspecified artificial knee joint: Secondary | ICD-10-CM | POA: Insufficient documentation

## 2019-05-18 DIAGNOSIS — Z85038 Personal history of other malignant neoplasm of large intestine: Secondary | ICD-10-CM | POA: Insufficient documentation

## 2019-05-18 DIAGNOSIS — F0390 Unspecified dementia without behavioral disturbance: Secondary | ICD-10-CM | POA: Insufficient documentation

## 2019-05-18 DIAGNOSIS — I2510 Atherosclerotic heart disease of native coronary artery without angina pectoris: Secondary | ICD-10-CM | POA: Insufficient documentation

## 2019-05-18 NOTE — ED Provider Notes (Signed)
[facility] DEPT Provider Note   CSN: [id number] Arrival date & time: 05/18/19  1638     History   Chief Complaint Chief Complaint  Patient presents with  . Fall    HPI Jermaine Castaneda is a 84 y.o. male.     HPI   He is here for evaluation of another fall.  He has had 4 prior falls within the last 6 weeks, all evaluated in the ED, and disposition back to his facility without significant abnormal findings.  Level 5 caveat-dementia  Past Medical History:  Diagnosis Date  . CAD (coronary artery disease)   . Hyperlipidemia   . Hypertension     Patient Active Problem List   Diagnosis Date Noted  . Cerebrovascular disease 10/11/2013  . Hemorrhoids, external with complications [id number]  . DUMPING SYNDROME 03/16/2010  . MALIGNANT NEOPLASM OF COLON UNSPECIFIED SITE 02/11/2010  . COLONIC POLYPS 12/11/2009  . ACTINIC KERATOSIS 07/15/2009  . LOC OSTEOARTHROS NOT SPEC PRIM/SEC LOWER LEG 10/02/2007  . HYPOTHYROIDISM 09/26/2007  . ANEMIA 09/26/2007  . PSA, INCREASED 09/26/2007  . HYPERLIPIDEMIA 04/27/2007  . HYPERTENSION 04/27/2007  . CORONARY ARTERY DISEASE 04/27/2007    Past Surgical History:  Procedure Laterality Date  . CORONARY ARTERY BYPASS GRAFT    . CORONARY ARTERY BYPASS GRAFT  1996  . TOTAL KNEE ARTHROPLASTY  2003        Home Medications    Prior to Admission medications   Medication Sig Start Date End Date Taking? Authorizing Provider  acetaminophen (TYLENOL) 325 MG tablet Take 650 mg by mouth every 6 (six) hours as needed for mild pain.    [provider]  amlodipine-atorvastatin (CADUET) 10-20 MG per tablet TAKE 1 TABLET BY MOUTH DAILY. Patient not taking: Reported on 05/14/2019 06/19/14   Ricard Dillon, MD  aspirin EC 81 MG tablet Take 1 tablet (81 mg total) by mouth daily. Patient not taking: Reported on 05/14/2019 10/11/13   Marletta Lor, MD  atorvastatin (LIPITOR) 20 MG tablet Take 20 mg by mouth daily  at 6 PM.    [provider]  bacitracin ointment Apply 1 application topically 2 (two) times daily. Patient not taking: Reported on 05/14/2019 01/10/19   Varney Biles, MD  BENICAR 40 MG tablet TAKE 1 TABLET EVERY DAY Patient not taking: Reported on 05/14/2019 09/24/13   Ricard Dillon, MD  busPIRone (BUSPAR) 10 MG tablet Take 10 mg by mouth 3 (three) times daily as needed for anxiety or agitation. 02/08/19   [provider]  busPIRone (BUSPAR) 5 MG tablet Take 5 mg by mouth 2 (two) times daily.    [provider]  clopidogrel (PLAVIX) 75 MG tablet TAKE 1 TABLET BY MOUTH EVERY DAY WITH BREAKFAST Patient not taking: Reported on 05/14/2019 04/08/14   Ricard Dillon, MD  divalproex (DEPAKOTE) 250 MG DR tablet Take 250 mg by mouth 2 (two) times daily.    [provider]  donepezil (ARICEPT) 10 MG tablet Take 10 mg by mouth at bedtime.    [provider]  loperamide (IMODIUM) 2 MG capsule Take 2 mg by mouth See admin instructions. Take 1 capsule by mouth after each loose stool. Max 16mg /day. 03/22/19   [provider]  losartan (COZAAR) 100 MG tablet Take 100 mg by mouth daily.    [provider]  memantine (NAMENDA) 10 MG tablet Take 10 mg by mouth 2 (two) times daily.    [provider]  metoprolol (LOPRESSOR) 50  MG tablet TAKE 1 TABLET TWICE A DAY Patient not taking: Reported on 05/14/2019 05/07/14   Ricard Dillon, MD  metoprolol tartrate (LOPRESSOR) 25 MG tablet Take 75 mg by mouth 2 (two) times daily.    [provider]  sertraline (ZOLOFT) 25 MG tablet TAKE 1 TABLET BY MOUTH EVERY DAY Patient taking differently: Take 50 mg by mouth daily.  01/08/14   Ricard Dillon, MD  Vitamin D, Ergocalciferol, (DRISDOL) 1.25 MG (50000 UT) CAPS capsule Take 50,000 Units by mouth daily.    [provider]  Zinc Oxide (DESITIN) 13 % CREA Apply 1 application topically daily. Apply on bottom one time a day and after diaper change  until healed.    [provider]    Family History Family History  Problem Relation Age of Onset  . Heart disease Mother   . Heart disease Father   . Hyperlipidemia Father   . Hypertension Father   . Coronary artery disease Other     Social History Social History   Tobacco Use  . Smoking status: Never Smoker  . Smokeless tobacco: Never Used  Substance Use Topics  . Alcohol use: Yes  . Drug use: No     Allergies   Patient has no known allergies.   Review of Systems Review of Systems  Unable to perform ROS: Dementia     Physical Exam Updated Vital Signs BP (!) 155/67 (BP Location: Right Arm)   Pulse (!) 56   Temp 98 F (36.7 C) (Oral)   Resp 13   SpO2 96%   Physical Exam Vitals signs and nursing note reviewed.  Constitutional:      General: He is not in acute distress.    Appearance: Normal appearance. He is well-developed. He is not ill-appearing or toxic-appearing.  HENT:     Head: Normocephalic and atraumatic.     Comments: No visible injury to head, face, jaw.    Right Ear: External ear normal.     Left Ear: External ear normal.  Eyes:     Conjunctiva/sclera: Conjunctivae normal.     Pupils: Pupils are equal, round, and reactive to light.  Neck:     Musculoskeletal: Normal range of motion and neck supple.     Trachea: Phonation normal.  Cardiovascular:     Rate and Rhythm: Normal rate and regular rhythm.  Pulmonary:     Effort: Pulmonary effort is normal.  Abdominal:     General: There is no distension.     Palpations: Abdomen is soft.     Tenderness: There is no abdominal tenderness.  Musculoskeletal: Normal range of motion.        General: No swelling, tenderness, deformity or signs of injury.  Skin:    General: Skin is warm and dry.  Neurological:     Mental Status: He is alert.     Cranial Nerves: No cranial nerve deficit.     Motor: No abnormal muscle tone.     Coordination: Coordination normal.  Psychiatric:        Mood  and Affect: Mood normal.        Behavior: Behavior normal.      ED Treatments / Results  Labs (all labs ordered are listed, but only abnormal results are displayed) Labs Reviewed - No data to display  EKG None  Radiology No results found.  Procedures Procedures (including critical care time)  Medications Ordered in ED Medications - No data to display   Initial Impression /  Assessment and Plan / ED Course  I have reviewed the triage vital signs and the nursing notes.  Pertinent labs & imaging results that were available during my care of the patient were reviewed by me and considered in my medical decision making (see chart for details).  Clinical Course as of May 17 1930  Fri May 18, 2019  1927 I talked to wife on phone; we discussed the recurrent falls and reassuring evaluation today.  She is not requesting further evaluation at this time.  She understands that he would be discharged back to his facility.   [EW]    Clinical Course User Index [EW] Daleen Bo, MD        Patient Vitals for the past 24 hrs:  BP Temp Temp src Pulse Resp SpO2  05/18/19 1700 (!) 155/67 98 F (36.7 C) Oral (!) 56 13 96 %  05/18/19 1653 - - - - - 96 %    7:32 PM Reevaluation with update and discussion. After initial assessment and treatment, an updated evaluation reveals no change in clinical status, findings discussed with the patient's wife, and all questions were answered. Daleen Bo   Medical Decision Making: Fall, recurrent, without injury.  No indication for further ED evaluation or hospitalization at this time.  CRITICAL CARE-no Performed by: Daleen Bo  Nursing Notes Reviewed/ Care Coordinated Applicable Imaging Reviewed Interpretation of Laboratory Data incorporated into ED treatment  The patient appears reasonably screened and/or stabilized for discharge and I doubt any other medical condition or other [facility] requiring further screening, evaluation, or treatment in  the ED at this time prior to discharge.  Plan: Home Medications-continue usual; Home Treatments-rest, fluids; return here if the recommended treatment, does not improve the symptoms; Recommended follow up-PCP,.   Final Clinical Impressions(s) / ED Diagnoses   Final diagnoses:  None    ED Discharge Orders    None       Daleen Bo, MD 05/18/19 1933

## 2019-05-18 NOTE — ED Triage Notes (Signed)
Per EMS, Pt is coming from [facility]. Pt had an unwitnessed fall in the bathroom. Staff believes he hit his head during fall. Pt is at baseline. HX of dementia. No complaints of pain and no obvious deformity. No blood thinner use.

## 2019-05-18 NOTE — Discharge Instructions (Addendum)
There were no abnormal findings following his evaluation today, for a fall.  He is safe for discharge to his usual care.  Make sure that he gets help when he attempts to ambulate, because he will likely fall again without close assistance and supervision.  Have him see his doctor at the facility, as soon as possible.

## 2019-06-30 ENCOUNTER — Emergency Department (HOSPITAL_COMMUNITY): Payer: Medicare Other

## 2019-06-30 ENCOUNTER — Emergency Department (HOSPITAL_COMMUNITY)
Admission: EM | Admit: 2019-06-30 | Discharge: 2019-06-30 | Disposition: A | Discharge status: Home / Self Care | Payer: Medicare Other | Attending: Emergency Medicine | Admitting: Emergency Medicine

## 2019-06-30 ENCOUNTER — Other Ambulatory Visit: Payer: Self-pay

## 2019-06-30 ENCOUNTER — Encounter (HOSPITAL_COMMUNITY): Payer: Self-pay

## 2019-06-30 DIAGNOSIS — I10 Essential (primary) hypertension: Secondary | ICD-10-CM | POA: Insufficient documentation

## 2019-06-30 DIAGNOSIS — F0390 Unspecified dementia without behavioral disturbance: Secondary | ICD-10-CM | POA: Diagnosis not present

## 2019-06-30 DIAGNOSIS — E039 Hypothyroidism, unspecified: Secondary | ICD-10-CM | POA: Insufficient documentation

## 2019-06-30 DIAGNOSIS — I25810 Atherosclerosis of coronary artery bypass graft(s) without angina pectoris: Secondary | ICD-10-CM | POA: Insufficient documentation

## 2019-06-30 DIAGNOSIS — R519 Headache, unspecified: Secondary | ICD-10-CM | POA: Insufficient documentation

## 2019-06-30 DIAGNOSIS — W19XXXA Unspecified fall, initial encounter: Secondary | ICD-10-CM

## 2019-06-30 DIAGNOSIS — Y92129 Unspecified place in nursing home as the place of occurrence of the external cause: Secondary | ICD-10-CM

## 2019-06-30 MED ORDER — ACETAMINOPHEN 325 MG PO TABS
650.0000 mg | ORAL_TABLET | Freq: Once | ORAL | Status: AC
  Administered 2019-06-30: 13:00:00 650 mg via ORAL
  Filled 2019-06-30: qty 2

## 2019-06-30 NOTE — ED Notes (Signed)
Patient transported to CT 

## 2019-06-30 NOTE — ED Notes (Signed)
Patient was picked up by [facility] for transport to [facility].

## 2019-06-30 NOTE — ED Triage Notes (Signed)
Patient presented to ed with c/o  unwitnessed fall. No obvious injury noted. Patient have dementia but alert to his based line. Staff request him to be evaluated. Patient is from [facility].

## 2019-06-30 NOTE — ED Notes (Signed)
[facility] HAS BEEN CALLED FOR PATIENT TRANSPORT. THEY STATED THERE ARE EIGHT PATIENT'S AHEAD OF HIM FOR TRANSPORT.

## 2019-06-30 NOTE — Discharge Instructions (Addendum)
The CT scans of your head and neck are not showing any evidence of injury today.  The xrays of your right elbow and left shoulder are also negative. You can take tylenol as needed for pain. Apply ice to areas of pain for 20 min at a time. Follow up with PCP.

## 2019-06-30 NOTE — ED Provider Notes (Signed)
[facility] DEPT Provider Note   CSN: [id number] Arrival date & time: 06/30/19  1109     History   Chief Complaint Chief Complaint  Patient presents with   Fall    HPI Jermaine Castaneda is a 84 y.o. male with past medical history of dementia, hypertension, patient presenting from assisted living facility after unwitnessed fall.  Patient is at his mental baseline per facility though sent him here for evaluation.  Patient is able to answer some yes or no questions, however history is limited due to dementia.  He does endorse headache.  Not on anticoagulation.  Level 5 caveat due to dementia.    The history is provided by the EMS personnel. The history is limited by the condition of the patient.    Past Medical History:  Diagnosis Date   CAD (coronary artery disease)    Hyperlipidemia    Hypertension     Patient Active Problem List   Diagnosis Date Noted   Cerebrovascular disease 10/11/2013   Hemorrhoids, external with complications [id number]   DUMPING SYNDROME 03/16/2010   MALIGNANT NEOPLASM OF COLON UNSPECIFIED SITE 02/11/2010   COLONIC POLYPS 12/11/2009   ACTINIC KERATOSIS 07/15/2009   LOC OSTEOARTHROS NOT SPEC PRIM/SEC LOWER LEG 10/02/2007   HYPOTHYROIDISM 09/26/2007   ANEMIA 09/26/2007   PSA, INCREASED 09/26/2007   HYPERLIPIDEMIA 04/27/2007   HYPERTENSION 04/27/2007   CORONARY ARTERY DISEASE 04/27/2007    Past Surgical History:  Procedure Laterality Date   CORONARY ARTERY BYPASS GRAFT     CORONARY ARTERY BYPASS GRAFT  1996   TOTAL KNEE ARTHROPLASTY  2003        Home Medications    Prior to Admission medications   Medication Sig Start Date End Date Taking? Authorizing Provider  busPIRone (BUSPAR) 5 MG tablet Take 5 mg by mouth 2 (two) times daily.   Yes [provider]  divalproex (DEPAKOTE) 250 MG DR tablet Take 250 mg by mouth 2 (two) times daily.   Yes [provider]  donepezil  (ARICEPT) 10 MG tablet Take 10 mg by mouth daily.    Yes [provider]  losartan (COZAAR) 100 MG tablet Take 100 mg by mouth daily.   Yes [provider]  memantine (NAMENDA) 10 MG tablet Take 10 mg by mouth 2 (two) times daily.   Yes [provider]  metoprolol tartrate (LOPRESSOR) 25 MG tablet Take 75 mg by mouth 2 (two) times daily.   Yes [provider]  sertraline (ZOLOFT) 25 MG tablet TAKE 1 TABLET BY MOUTH EVERY DAY Patient taking differently: Take 25 mg by mouth daily.  01/08/14  Yes Ricard Dillon, MD    Family History Family History  Problem Relation Age of Onset   Heart disease Mother    Heart disease Father    Hyperlipidemia Father    Hypertension Father    Coronary artery disease Other     Social History Social History   Tobacco Use   Smoking status: Never Smoker   Smokeless tobacco: Never Used  Substance Use Topics   Alcohol use: Yes   Drug use: No     Allergies   Patient has no known allergies.   Review of Systems Review of Systems  Unable to perform ROS: Dementia     Physical Exam Updated Vital Signs BP (!) 164/80 (BP Location: Right Arm)    Pulse (!) 49    Temp 97.9 F (36.6 C) (Oral)    Resp 15  SpO2 100%   Physical Exam Vitals signs and nursing note reviewed.  Constitutional:      General: He is not in acute distress.    Appearance: He is well-developed.     Comments: Pleasant, in no distress  HENT:     Head: Normocephalic and atraumatic.     Comments: No evidence of head trauma Eyes:     Conjunctiva/sclera: Conjunctivae normal.  Cardiovascular:     Rate and Rhythm: Normal rate and regular rhythm.     Heart sounds: Murmur present.  Pulmonary:     Effort: Pulmonary effort is normal.     Breath sounds: Normal breath sounds.  Abdominal:     General: Bowel sounds are normal.     Palpations: Abdomen is soft.     Tenderness: There is no abdominal tenderness.  Musculoskeletal:     Comments:  Right elbow with TTP, mild swelling over the olecranon, no deformity. Bruising and small superficial nonbleeding skin tear to right proximal forearm.   left shoulder with TTP to superior aspect, no swelling or deformity.  Edema present BLE, no deformity. Pelvis is stable, no pain with ranging hips.   Skin:    General: Skin is warm.  Neurological:     Mental Status: He is alert. Mental status is at baseline.  Psychiatric:        Behavior: Behavior normal.      ED Treatments / Results  Labs (all labs ordered are listed, but only abnormal results are displayed) Labs Reviewed - No data to display  EKG None  Radiology Dg Elbow Complete Right  Result Date: 06/30/2019 CLINICAL DATA:  Fall EXAM: RIGHT ELBOW - COMPLETE 3+ VIEW COMPARISON:  None. FINDINGS: No fracture or dislocation of the right elbow. The joint space is preserved. No elbow joint effusion. Soft tissue edema over the olecranon. IMPRESSION: 1. No fracture or dislocation of the right elbow. The joint space is preserved. No elbow joint effusion. 2.  Soft tissue edema over the olecranon. Electronically Signed   By: Eddie Candle M.D.   On: 06/30/2019 12:30   Ct Head Wo Contrast  Result Date: 06/30/2019 CLINICAL DATA:  Un witnessed fall. Laceration behind right ear. Patient has dementia, currently at baseline. EXAM: CT HEAD WITHOUT CONTRAST CT CERVICAL SPINE WITHOUT CONTRAST TECHNIQUE: Multidetector CT imaging of the head and cervical spine was performed following the standard protocol without intravenous contrast. Multiplanar CT image reconstructions of the cervical spine were also generated. COMPARISON:  04/01/2019 FINDINGS: CT HEAD FINDINGS Brain: No evidence of acute infarction, hemorrhage, hydrocephalus, extra-axial collection or mass lesion/mass effect. Ventricular and sulcal enlargement noted consistent with moderate atrophy. Patchy white matter hypoattenuation is present consistent with mild chronic microvascular ischemic  change. Vascular: No hyperdense vessel or unexpected calcification. Skull: Normal. Negative for fracture or focal lesion. Sinuses/Orbits: Globes and orbits are unremarkable. Sinuses essentially clear. Clear mastoid air cells. Other: None. CT CERVICAL SPINE FINDINGS Alignment: Normal. Skull base and vertebrae: No fracture. No aggressive bone lesion. Stable benign sclerotic bone lesion in C2. Soft tissues and spinal canal: No prevertebral fluid or swelling. No visible canal hematoma. Disc levels: Mild loss of disc height at C2-C3, C4-C5 and C5-C6. There are facet degenerative changes bilaterally, greater on the left. No convincing disc herniation. Upper chest: No acute findings.  Clear lung apices. Other: None. IMPRESSION: HEAD CT 1. No acute intracranial abnormalities.  No skull fracture. 2. Atrophy and mild chronic microvascular ischemic change. CERVICAL CT 1. No fracture or acute finding. Electronically  Signed   By: Lajean Manes M.D.   On: 06/30/2019 13:02   Ct Cervical Spine Wo Contrast  Result Date: 06/30/2019 CLINICAL DATA:  Un witnessed fall. Laceration behind right ear. Patient has dementia, currently at baseline. EXAM: CT HEAD WITHOUT CONTRAST CT CERVICAL SPINE WITHOUT CONTRAST TECHNIQUE: Multidetector CT imaging of the head and cervical spine was performed following the standard protocol without intravenous contrast. Multiplanar CT image reconstructions of the cervical spine were also generated. COMPARISON:  04/01/2019 FINDINGS: CT HEAD FINDINGS Brain: No evidence of acute infarction, hemorrhage, hydrocephalus, extra-axial collection or mass lesion/mass effect. Ventricular and sulcal enlargement noted consistent with moderate atrophy. Patchy white matter hypoattenuation is present consistent with mild chronic microvascular ischemic change. Vascular: No hyperdense vessel or unexpected calcification. Skull: Normal. Negative for fracture or focal lesion. Sinuses/Orbits: Globes and orbits are unremarkable.  Sinuses essentially clear. Clear mastoid air cells. Other: None. CT CERVICAL SPINE FINDINGS Alignment: Normal. Skull base and vertebrae: No fracture. No aggressive bone lesion. Stable benign sclerotic bone lesion in C2. Soft tissues and spinal canal: No prevertebral fluid or swelling. No visible canal hematoma. Disc levels: Mild loss of disc height at C2-C3, C4-C5 and C5-C6. There are facet degenerative changes bilaterally, greater on the left. No convincing disc herniation. Upper chest: No acute findings.  Clear lung apices. Other: None. IMPRESSION: HEAD CT 1. No acute intracranial abnormalities.  No skull fracture. 2. Atrophy and mild chronic microvascular ischemic change. CERVICAL CT 1. No fracture or acute finding. Electronically Signed   By: Lajean Manes M.D.   On: 06/30/2019 13:02   Dg Shoulder Left  Result Date: 06/30/2019 CLINICAL DATA:  Fall EXAM: LEFT SHOULDER - 2+ VIEW COMPARISON:  Chest radiographs, 01/10/2019 FINDINGS: No fracture or dislocation of the left shoulder. There is very severe, bone-on-bone arthrosis of the left glenohumeral joint with a large inferiorly directed osteophyte of the glenoid. Moderate acromioclavicular arthrosis. Nonacute fracture deformities of the partially imaged left ribs. IMPRESSION: 1. No fracture or dislocation of the left shoulder. 2. There is very severe, bone-on-bone arthrosis of the left glenohumeral joint with a large inferiorly directed osteophyte of the glenoid. Moderate acromioclavicular arthrosis. 3.  Nonacute fracture deformities of the partially imaged left ribs. Electronically Signed   By: Eddie Candle M.D.   On: 06/30/2019 12:28    Procedures Procedures (including critical care time)  Medications Ordered in ED Medications  acetaminophen (TYLENOL) tablet 650 mg (650 mg Oral Given 06/30/19 1236)     Initial Impression / Assessment and Plan / ED Course  I have reviewed the triage vital signs and the nursing notes.  Pertinent labs & imaging  results that were available during my care of the patient were reviewed by me and considered in my medical decision making (see chart for details).  Clinical Course as of Jun 29 1422  Sat Oct 31, 269  3723 84 year old male with history of dementia here from his facility after an unwitnessed fall.  Patient has difficulty get any history from the seems his head hurts him the most.  CTs of head cervical spine along with some plain films do not show any acute findings.  Patient will return back to facility where they can continue to observe him.   [MB]    Clinical Course User Index [MB] Hayden Rasmussen, MD       Patient here from assisted living facility after unwitnessed fall.  At his baseline with dementia.  No obvious injuries per nursing facility though sent to the  ED for evaluation.  History is difficult to obtain due to dementia though patient is complaining of headache as well as tenderness to right elbow and left shoulder.  Not on anticoagulation.  Patient is in no distress and well-appearing.  CT imaging of head and C-spine are negative for acute injury.  Left shoulder and right elbow images are also negative.  At this time patient is safe for discharge back to facility symptomatic management as needed.  Patient discussed with and evaluated by Dr. Melina Copa.  Patient safe for discharge.  Discussed results, findings, treatment and follow up. Patient advised of return precautions. Patient verbalized understanding and agreed with plan.  Final Clinical Impressions(s) / ED Diagnoses   Final diagnoses:  Fall at nursing home, initial encounter    ED Discharge Orders    None       Willodene Stallings, [place], [place] 06/30/19 [place]    Hayden Rasmussen, MD 07/01/19 [phone]

## 2019-08-07 ENCOUNTER — Encounter (HOSPITAL_COMMUNITY): Payer: Self-pay | Admitting: Emergency Medicine

## 2019-08-07 ENCOUNTER — Emergency Department (HOSPITAL_COMMUNITY)

## 2019-08-07 ENCOUNTER — Other Ambulatory Visit: Payer: Self-pay

## 2019-08-07 ENCOUNTER — Emergency Department (HOSPITAL_COMMUNITY)
Admission: EM | Admit: 2019-08-07 | Discharge: 2019-08-07 | Disposition: A | Discharge status: Home / Self Care | Attending: Emergency Medicine | Admitting: Emergency Medicine

## 2019-08-07 DIAGNOSIS — Y999 Unspecified external cause status: Secondary | ICD-10-CM | POA: Diagnosis not present

## 2019-08-07 DIAGNOSIS — S60212A Contusion of left wrist, initial encounter: Secondary | ICD-10-CM | POA: Insufficient documentation

## 2019-08-07 DIAGNOSIS — Y939 Activity, unspecified: Secondary | ICD-10-CM | POA: Insufficient documentation

## 2019-08-07 DIAGNOSIS — Y92129 Unspecified place in nursing home as the place of occurrence of the external cause: Secondary | ICD-10-CM | POA: Insufficient documentation

## 2019-08-07 DIAGNOSIS — S0083XA Contusion of other part of head, initial encounter: Secondary | ICD-10-CM | POA: Insufficient documentation

## 2019-08-07 DIAGNOSIS — I10 Essential (primary) hypertension: Secondary | ICD-10-CM | POA: Diagnosis not present

## 2019-08-07 DIAGNOSIS — Z79899 Other long term (current) drug therapy: Secondary | ICD-10-CM | POA: Diagnosis not present

## 2019-08-07 DIAGNOSIS — I2510 Atherosclerotic heart disease of native coronary artery without angina pectoris: Secondary | ICD-10-CM | POA: Diagnosis not present

## 2019-08-07 DIAGNOSIS — F0390 Unspecified dementia without behavioral disturbance: Secondary | ICD-10-CM | POA: Insufficient documentation

## 2019-08-07 DIAGNOSIS — Z951 Presence of aortocoronary bypass graft: Secondary | ICD-10-CM | POA: Insufficient documentation

## 2019-08-07 DIAGNOSIS — W19XXXA Unspecified fall, initial encounter: Secondary | ICD-10-CM | POA: Insufficient documentation

## 2019-08-07 DIAGNOSIS — S0990XA Unspecified injury of head, initial encounter: Secondary | ICD-10-CM | POA: Diagnosis present

## 2019-08-07 DIAGNOSIS — E039 Hypothyroidism, unspecified: Secondary | ICD-10-CM | POA: Insufficient documentation

## 2019-08-07 NOTE — ED Triage Notes (Signed)
Pt arrived from [facility] via EMS. EMS states that staff of [facility] found the pt on the floor after an unwitnessed fall. Pt is alert to person, which is his baseline. Pt has a bruise on his forehead and a bump on his left hand. Pt is not on any blood thinners.

## 2019-08-07 NOTE — ED Notes (Signed)
Pt's wife updated.

## 2019-08-07 NOTE — ED Provider Notes (Signed)
[facility] DEPT Provider Note   CSN: [id number] Arrival date & time: 08/07/19  0401     History   Chief Complaint Chief Complaint  Patient presents with  . Fall    HPI Jermaine Castaneda is a 84 y.o. male.     Patient brought to the emergency department by EMS from nursing facility after a fall.  Patient complaining of headache, does not remember the fall.  Is not clear what caused the fall.  Patient has a history of dementia, therefore level 5 caveat.     Past Medical History:  Diagnosis Date  . CAD (coronary artery disease)   . Hyperlipidemia   . Hypertension     Patient Active Problem List   Diagnosis Date Noted  . Cerebrovascular disease 10/11/2013  . Hemorrhoids, external with complications [id number]  . DUMPING SYNDROME 03/16/2010  . MALIGNANT NEOPLASM OF COLON UNSPECIFIED SITE 02/11/2010  . COLONIC POLYPS 12/11/2009  . ACTINIC KERATOSIS 07/15/2009  . LOC OSTEOARTHROS NOT SPEC PRIM/SEC LOWER LEG 10/02/2007  . HYPOTHYROIDISM 09/26/2007  . ANEMIA 09/26/2007  . PSA, INCREASED 09/26/2007  . HYPERLIPIDEMIA 04/27/2007  . HYPERTENSION 04/27/2007  . CORONARY ARTERY DISEASE 04/27/2007    Past Surgical History:  Procedure Laterality Date  . CORONARY ARTERY BYPASS GRAFT    . CORONARY ARTERY BYPASS GRAFT  1996  . TOTAL KNEE ARTHROPLASTY  2003        Home Medications    Prior to Admission medications   Medication Sig Start Date End Date Taking? Authorizing Provider  acetaminophen (TYLENOL) 325 MG tablet Take 650 mg by mouth every 6 (six) hours as needed for mild pain.   Yes [provider]  busPIRone (BUSPAR) 5 MG tablet Take 5 mg by mouth 2 (two) times daily.   Yes [provider]  divalproex (DEPAKOTE) 250 MG DR tablet Take 250 mg by mouth 2 (two) times daily.   Yes [provider]  donepezil (ARICEPT) 10 MG tablet Take 10 mg by mouth daily.    Yes [provider]  losartan (COZAAR) 100 MG tablet  Take 100 mg by mouth daily.   Yes [provider]  memantine (NAMENDA) 10 MG tablet Take 10 mg by mouth 2 (two) times daily.   Yes [provider]  metoprolol tartrate (LOPRESSOR) 25 MG tablet Take 75 mg by mouth 2 (two) times daily.   Yes [provider]  sertraline (ZOLOFT) 25 MG tablet TAKE 1 TABLET BY MOUTH EVERY DAY Patient taking differently: Take 25 mg by mouth daily.  01/08/14  Yes Ricard Dillon, MD    Family History Family History  Problem Relation Age of Onset  . Heart disease Mother   . Heart disease Father   . Hyperlipidemia Father   . Hypertension Father   . Coronary artery disease Other     Social History Social History   Tobacco Use  . Smoking status: Never Smoker  . Smokeless tobacco: Never Used  Substance Use Topics  . Alcohol use: Yes  . Drug use: No     Allergies   Patient has no known allergies.   Review of Systems Review of Systems  Unable to perform ROS: Dementia     Physical Exam Updated Vital Signs BP (!) 148/75   Pulse (!) 54   Temp 98 F (36.7 C) (Oral)   Resp 11   Ht 6\' 1"  (1.854 m)   Wt 81.2 kg   SpO2 98%   BMI 23.62 kg/m  Physical Exam Vitals signs and nursing note reviewed.  Constitutional:      General: He is not in acute distress.    Appearance: Normal appearance. He is well-developed.  HENT:     Head: Normocephalic. Contusion present.      Right Ear: Hearing normal.     Left Ear: Hearing normal.     Nose: Nose normal.  Eyes:     Conjunctiva/sclera: Conjunctivae normal.     Pupils: Pupils are equal, round, and reactive to light.  Neck:     Musculoskeletal: Normal range of motion and neck supple.  Cardiovascular:     Rate and Rhythm: Regular rhythm.     Heart sounds: S1 normal and S2 normal. No murmur. No friction rub. No gallop.   Pulmonary:     Effort: Pulmonary effort is normal. No respiratory distress.     Breath sounds: Normal breath sounds.  Chest:     Chest wall: No  tenderness.  Abdominal:     General: Bowel sounds are normal.     Palpations: Abdomen is soft.     Tenderness: There is no abdominal tenderness. There is no guarding or rebound. Negative signs include Murphy's sign and McBurney's sign.     Hernia: No hernia is present.  Musculoskeletal: Normal range of motion.     Left wrist: He exhibits tenderness and swelling (Dorsal proximal hand and left wrist). He exhibits no deformity.  Skin:    General: Skin is warm and dry.     Findings: No rash.  Neurological:     Mental Status: He is alert and oriented to person, place, and time. He is confused.     GCS: GCS eye subscore is 4. GCS verbal subscore is 4. GCS motor subscore is 6.     Cranial Nerves: No cranial nerve deficit.     Sensory: No sensory deficit.     Coordination: Coordination normal.  Psychiatric:        Speech: Speech normal.        Behavior: Behavior normal.        Thought Content: Thought content normal.      ED Treatments / Results  Labs (all labs ordered are listed, but only abnormal results are displayed) Labs Reviewed - No data to display  EKG None  Radiology Dg Wrist Complete Left  Result Date: 08/07/2019 CLINICAL DATA:  Unwitnessed fall at nursing home EXAM: LEFT WRIST - COMPLETE 3+ VIEW COMPARISON:  None. FINDINGS: Soft tissue swelling dorsal to the carpus. No underlying acute fracture or malalignment. Remote fifth metacarpal neck fracture. Generalized osteopenia. IMPRESSION: Dorsal soft tissue swelling without acute fracture. Electronically Signed   By: Monte Fantasia M.D.   On: 08/07/2019 04:41   Ct Head Wo Contrast  Result Date: 08/07/2019 CLINICAL DATA:  Posttraumatic headache EXAM: CT HEAD WITHOUT CONTRAST CT CERVICAL SPINE WITHOUT CONTRAST TECHNIQUE: Multidetector CT imaging of the head and cervical spine was performed following the standard protocol without intravenous contrast. Multiplanar CT image reconstructions of the cervical spine were also generated.  COMPARISON:  06/30/2019 FINDINGS: CT HEAD FINDINGS Brain: No evidence of acute infarction, hemorrhage, hydrocephalus, extra-axial collection or mass lesion/mass effect. Generalized atrophy with minor chronic small vessel ischemic type change. Vascular: No hyperdense vessel or unexpected calcification. Skull: Bifrontal scalp scarring which is similar to prior. Sinuses/Orbits: No evidence of injury. Small incidental polyp in the right nasal cavity. CT CERVICAL SPINE FINDINGS Alignment: No traumatic malalignment Skull base and vertebrae: Negative for acute fracture. Stable benign sclerotic  focus in the C2 left paramedian body Soft tissues and spinal canal: No prevertebral fluid or swelling. No visible canal hematoma. Disc levels: Degenerative facet spurring asymmetric to the left. Generalized disc narrowing greatest at C5-6. Upper chest: Negative IMPRESSION: 1. No evidence of acute intracranial or cervical spine injury. 2. Chronic findings are stable from priors and described above. Electronically Signed   By: Monte Fantasia M.D.   On: 08/07/2019 05:01   Ct Cervical Spine Wo Contrast  Result Date: 08/07/2019 CLINICAL DATA:  Posttraumatic headache EXAM: CT HEAD WITHOUT CONTRAST CT CERVICAL SPINE WITHOUT CONTRAST TECHNIQUE: Multidetector CT imaging of the head and cervical spine was performed following the standard protocol without intravenous contrast. Multiplanar CT image reconstructions of the cervical spine were also generated. COMPARISON:  06/30/2019 FINDINGS: CT HEAD FINDINGS Brain: No evidence of acute infarction, hemorrhage, hydrocephalus, extra-axial collection or mass lesion/mass effect. Generalized atrophy with minor chronic small vessel ischemic type change. Vascular: No hyperdense vessel or unexpected calcification. Skull: Bifrontal scalp scarring which is similar to prior. Sinuses/Orbits: No evidence of injury. Small incidental polyp in the right nasal cavity. CT CERVICAL SPINE FINDINGS Alignment: No  traumatic malalignment Skull base and vertebrae: Negative for acute fracture. Stable benign sclerotic focus in the C2 left paramedian body Soft tissues and spinal canal: No prevertebral fluid or swelling. No visible canal hematoma. Disc levels: Degenerative facet spurring asymmetric to the left. Generalized disc narrowing greatest at C5-6. Upper chest: Negative IMPRESSION: 1. No evidence of acute intracranial or cervical spine injury. 2. Chronic findings are stable from priors and described above. Electronically Signed   By: Monte Fantasia M.D.   On: 08/07/2019 05:01   Dg Hand Complete Left  Result Date: 08/07/2019 CLINICAL DATA:  Fall at nursing home. EXAM: LEFT HAND - COMPLETE 3+ VIEW COMPARISON:  None. FINDINGS: Dorsal soft tissue swelling at the level of the carpus. No acute fracture or malalignment. Remote fifth metacarpal neck fracture. Generalized osteopenia. IMPRESSION: Soft tissue swelling without acute osseous finding. Electronically Signed   By: Monte Fantasia M.D.   On: 08/07/2019 04:42    Procedures Procedures (including critical care time)  Medications Ordered in ED Medications - No data to display   Initial Impression / Assessment and Plan / ED Course  I have reviewed the triage vital signs and the nursing notes.  Pertinent labs & imaging results that were available during my care of the patient were reviewed by me and considered in my medical decision making (see chart for details).        Patient brought to the emergency department for evaluation after an unwitnessed fall.  Patient has a history of dementia, is currently in a skilled nursing facility.  Patient was found on the floor with a bruise on his left forehead.  He is awake and alert, confused, at his normal baseline mental status.  CT head and cervical spine did not show any acute abnormality.  Patient's only other injury on exam is bruising and swelling over the left dorsal wrist area.  X-ray negative.  This is  consistent with hematoma.  Ace wrap applied.  Remainder of exam unremarkable.  Patient able to move both legs without difficulty or pain, no concern for hip injury.  Final Clinical Impressions(s) / ED Diagnoses   Final diagnoses:  Contusion of face, initial encounter  Traumatic hematoma of left wrist, initial encounter    ED Discharge Orders    None       , Gwenyth Allegra, MD 08/07/19 [phone]

## 2019-08-07 NOTE — ED Notes (Signed)
[facility] called for transport.  

## 2019-10-15 ENCOUNTER — Emergency Department (HOSPITAL_COMMUNITY): Payer: Medicare Other

## 2019-10-15 ENCOUNTER — Emergency Department (HOSPITAL_COMMUNITY)
Admission: EM | Admit: 2019-10-15 | Discharge: 2019-10-15 | Disposition: A | Discharge status: Home / Self Care | Payer: Medicare Other | Attending: Emergency Medicine | Admitting: Emergency Medicine

## 2019-10-15 ENCOUNTER — Other Ambulatory Visit: Payer: Self-pay

## 2019-10-15 ENCOUNTER — Encounter (HOSPITAL_COMMUNITY): Payer: Self-pay

## 2019-10-15 DIAGNOSIS — Z951 Presence of aortocoronary bypass graft: Secondary | ICD-10-CM | POA: Diagnosis not present

## 2019-10-15 DIAGNOSIS — W19XXXA Unspecified fall, initial encounter: Secondary | ICD-10-CM

## 2019-10-15 DIAGNOSIS — I10 Essential (primary) hypertension: Secondary | ICD-10-CM | POA: Insufficient documentation

## 2019-10-15 DIAGNOSIS — W06XXXA Fall from bed, initial encounter: Secondary | ICD-10-CM | POA: Diagnosis not present

## 2019-10-15 DIAGNOSIS — I2510 Atherosclerotic heart disease of native coronary artery without angina pectoris: Secondary | ICD-10-CM | POA: Insufficient documentation

## 2019-10-15 DIAGNOSIS — Y999 Unspecified external cause status: Secondary | ICD-10-CM | POA: Insufficient documentation

## 2019-10-15 DIAGNOSIS — Z96659 Presence of unspecified artificial knee joint: Secondary | ICD-10-CM | POA: Insufficient documentation

## 2019-10-15 DIAGNOSIS — E039 Hypothyroidism, unspecified: Secondary | ICD-10-CM | POA: Diagnosis not present

## 2019-10-15 DIAGNOSIS — Z79899 Other long term (current) drug therapy: Secondary | ICD-10-CM | POA: Insufficient documentation

## 2019-10-15 DIAGNOSIS — Y92122 Bedroom in nursing home as the place of occurrence of the external cause: Secondary | ICD-10-CM | POA: Diagnosis not present

## 2019-10-15 DIAGNOSIS — Y939 Activity, unspecified: Secondary | ICD-10-CM | POA: Insufficient documentation

## 2019-10-15 DIAGNOSIS — F0390 Unspecified dementia without behavioral disturbance: Secondary | ICD-10-CM | POA: Diagnosis not present

## 2019-10-15 DIAGNOSIS — S01111A Laceration without foreign body of right eyelid and periocular area, initial encounter: Secondary | ICD-10-CM | POA: Diagnosis present

## 2019-10-15 DIAGNOSIS — S0531XA Ocular laceration without prolapse or loss of intraocular tissue, right eye, initial encounter: Secondary | ICD-10-CM

## 2019-10-15 HISTORY — DX: Unspecified dementia, unspecified severity, without behavioral disturbance, psychotic disturbance, mood disturbance, and anxiety: F03.90

## 2019-10-15 NOTE — ED Triage Notes (Signed)
Patient fell out of bed and has a laceration to left eyebrow

## 2019-10-15 NOTE — Discharge Instructions (Addendum)
It was our pleasure to provide your ER care today - we hope that you feel better.  Fall precautions. Keep wound very clean.  Follow up with primary care doctor.   Return to ER if worse, new symptoms, new or severe pain, fevers, fainting, infection of wound, or other concern.

## 2019-10-15 NOTE — ED Notes (Signed)
Patient transported to CT 

## 2019-10-15 NOTE — ED Provider Notes (Signed)
[facility] DEPT Provider Note   CSN: [id number] Arrival date & time: 10/15/19  [id number]   History Chief Complaint  Patient presents with  . Fall  . Laceration    Jermaine Castaneda is a 85 y.o. male.  The history is provided by the patient, the EMS personnel and the nursing home. The history is limited by the condition of the patient (Dementia).  Fall  Laceration He has history of hypertension, hyperlipidemia, dementia and is brought in by ambulance after falling from bed at the skilled nursing facility where he resides.  He suffered a laceration in the right eyebrow.  Per nursing home staff, patient's mental status is at his baseline.  Past Medical History:  Diagnosis Date  . CAD (coronary artery disease)   . Hyperlipidemia   . Hypertension     Patient Active Problem List   Diagnosis Date Noted  . Cerebrovascular disease 10/11/2013  . Hemorrhoids, external with complications [id number]  . DUMPING SYNDROME 03/16/2010  . MALIGNANT NEOPLASM OF COLON UNSPECIFIED SITE 02/11/2010  . COLONIC POLYPS 12/11/2009  . ACTINIC KERATOSIS 07/15/2009  . LOC OSTEOARTHROS NOT SPEC PRIM/SEC LOWER LEG 10/02/2007  . HYPOTHYROIDISM 09/26/2007  . ANEMIA 09/26/2007  . PSA, INCREASED 09/26/2007  . HYPERLIPIDEMIA 04/27/2007  . HYPERTENSION 04/27/2007  . CORONARY ARTERY DISEASE 04/27/2007    Past Surgical History:  Procedure Laterality Date  . CORONARY ARTERY BYPASS GRAFT    . CORONARY ARTERY BYPASS GRAFT  1996  . TOTAL KNEE ARTHROPLASTY  2003       Family History  Problem Relation Age of Onset  . Heart disease Mother   . Heart disease Father   . Hyperlipidemia Father   . Hypertension Father   . Coronary artery disease Other     Social History   Tobacco Use  . Smoking status: Never Smoker  . Smokeless tobacco: Never Used  Substance Use Topics  . Alcohol use: Yes  . Drug use: No    Home Medications Prior to Admission medications   Medication Sig  Start Date End Date Taking? Authorizing Provider  acetaminophen (TYLENOL) 325 MG tablet Take 650 mg by mouth every 6 (six) hours as needed for mild pain.    [provider]  busPIRone (BUSPAR) 5 MG tablet Take 5 mg by mouth 2 (two) times daily.    [provider]  divalproex (DEPAKOTE) 250 MG DR tablet Take 250 mg by mouth 2 (two) times daily.    [provider]  donepezil (ARICEPT) 10 MG tablet Take 10 mg by mouth daily.     [provider]  losartan (COZAAR) 100 MG tablet Take 100 mg by mouth daily.    [provider]  memantine (NAMENDA) 10 MG tablet Take 10 mg by mouth 2 (two) times daily.    [provider]  metoprolol tartrate (LOPRESSOR) 25 MG tablet Take 75 mg by mouth 2 (two) times daily.    [provider]  sertraline (ZOLOFT) 25 MG tablet TAKE 1 TABLET BY MOUTH EVERY DAY Patient taking differently: Take 25 mg by mouth daily.  01/08/14   Ricard Dillon, MD    Allergies    Patient has no known allergies.  Review of Systems   Review of Systems  Unable to perform ROS: Dementia    Physical Exam Updated Vital Signs BP 136/84   Pulse 65   Temp 98.3 F (36.8 C)   Resp 17   SpO2 100%   Physical Exam Vitals and  nursing note reviewed.   85 year old male, resting comfortably and in no acute distress. Vital signs are normal. Oxygen saturation is 100%, which is normal. Head is normocephalic.  Small laceration present in the right eyebrow, not requiring closure. PERRLA, EOMI. Oropharynx is clear. Neck is immobilized in a stiff cervical collar and is nontender without adenopathy or JVD. Back is nontender and there is no CVA tenderness. Lungs are clear without rales, wheezes, or rhonchi. Chest is nontender. Heart has regular rate and rhythm without murmur. Abdomen is soft, flat, nontender without masses or hepatosplenomegaly and peristalsis is normoactive. Extremities have no cyanosis or edema, full range of motion is  present. Skin is warm and dry without rash. Neurologic: Awake and alert, oriented to person but not place or time.  Demonstrates perseveration of speech.  Cranial nerves are intact, there are no motor or sensory deficits.    ED Results / Procedures / Treatments    Radiology No results found.  Procedures Procedures   Medications Ordered in ED Medications - No data to display  ED Course  I have reviewed the triage vital signs and the nursing notes.  Pertinent imaging results that were available during my care of the patient were reviewed by me and considered in my medical decision making (see chart for details).  MDM Rules/Calculators/A&P Fall at nursing home with minor laceration through the right eyebrow, not requiring closure.  Old records are reviewed, and he has multiple ED visits for falls.  He will be sent for CT of head, cervical spine, maxillofacial bones.  Per old records, last tetanus immunization was in 2020.  Case is signed out to Dr. Ashok Cordia.  Final Clinical Impression(s) / ED Diagnoses Final diagnoses:  Fall at nursing home, initial encounter  Laceration of right eyeball, initial encounter    Rx / DC Orders ED Discharge Orders    None       Delora Fuel, MD [phone]

## 2019-10-15 NOTE — Discharge Planning (Signed)
Pt currently active with [facility] for home hospice services.  Resumption of care requested. Celesta Gentile of [facility] notified and will follow-up with pt upon return to [facility].

## 2019-10-15 NOTE — ED Provider Notes (Signed)
Signed out by Dr Roxanne Mins at 0000000 to d/c to home when cts negative.  CTs negative for acute process.  Pt remains alert, content, no distress.   Pt currently appears stable for d/c per Dr Colin Broach plan.     Lajean Saver, MD 10/15/19 [phone]

## 2019-10-15 NOTE — ED Notes (Signed)
Marshall Cork from [facility] notified of patients return.

## 2019-10-22 ENCOUNTER — Encounter (HOSPITAL_COMMUNITY): Payer: Self-pay | Admitting: Emergency Medicine

## 2019-10-22 ENCOUNTER — Emergency Department (HOSPITAL_COMMUNITY): Payer: Medicare Other

## 2019-10-22 ENCOUNTER — Emergency Department (HOSPITAL_COMMUNITY)
Admission: EM | Admit: 2019-10-22 | Discharge: 2019-10-23 | Disposition: A | Discharge status: Skilled Nursing Facility | Payer: Medicare Other | Attending: Emergency Medicine | Admitting: Emergency Medicine

## 2019-10-22 ENCOUNTER — Other Ambulatory Visit: Payer: Self-pay

## 2019-10-22 DIAGNOSIS — W19XXXA Unspecified fall, initial encounter: Secondary | ICD-10-CM | POA: Insufficient documentation

## 2019-10-22 DIAGNOSIS — R296 Repeated falls: Secondary | ICD-10-CM

## 2019-10-22 DIAGNOSIS — F0390 Unspecified dementia without behavioral disturbance: Secondary | ICD-10-CM | POA: Diagnosis not present

## 2019-10-22 DIAGNOSIS — I2510 Atherosclerotic heart disease of native coronary artery without angina pectoris: Secondary | ICD-10-CM | POA: Insufficient documentation

## 2019-10-22 DIAGNOSIS — Y92129 Unspecified place in nursing home as the place of occurrence of the external cause: Secondary | ICD-10-CM | POA: Diagnosis not present

## 2019-10-22 DIAGNOSIS — I119 Hypertensive heart disease without heart failure: Secondary | ICD-10-CM | POA: Diagnosis not present

## 2019-10-22 DIAGNOSIS — S51012A Laceration without foreign body of left elbow, initial encounter: Secondary | ICD-10-CM | POA: Insufficient documentation

## 2019-10-22 DIAGNOSIS — Z79899 Other long term (current) drug therapy: Secondary | ICD-10-CM | POA: Insufficient documentation

## 2019-10-22 DIAGNOSIS — S0001XA Abrasion of scalp, initial encounter: Secondary | ICD-10-CM | POA: Diagnosis present

## 2019-10-22 DIAGNOSIS — Y9389 Activity, other specified: Secondary | ICD-10-CM | POA: Diagnosis not present

## 2019-10-22 DIAGNOSIS — S0990XA Unspecified injury of head, initial encounter: Secondary | ICD-10-CM

## 2019-10-22 DIAGNOSIS — Y999 Unspecified external cause status: Secondary | ICD-10-CM | POA: Diagnosis not present

## 2019-10-22 NOTE — ED Triage Notes (Signed)
Pt arrived via [facility] from [facility] due to an unwitnessed fall. Skin tear to L arm and laceration to posterior of head. Pt stated he did not use his walker while ambulating to the bathroom and fell. Pt was on the floor for approx 30 minutes with no LOC.   BP 176/86 HR 66 RR 18 SpO2 99% RA Temp 98.9 Oral   CBG 112

## 2019-10-22 NOTE — ED Provider Notes (Signed)
TIME SEEN: 11:33 PM  CHIEF COMPLAINT: Fall  HPI: Patient is an 85 year old male with history of dementia, hypertension, hyperlipidemia, CAD who presents to the emergency department with EMS from [facility] after he had an unwitnessed fall.  Patient unable to provide any history.  He is not on antiplatelets or anticoagulants.  Appears he has history of frequent falls.  Last year 10/15/2019.  Patient is on hospice.  According to our records his last T dap was 04/24/2019.  ROS: Level 5 caveat secondary to dementia  PAST MEDICAL HISTORY/PAST SURGICAL HISTORY:  Past Medical History:  Diagnosis Date  . CAD (coronary artery disease)   . Dementia ([place]) 10/15/2019   source: [facility]  . Hyperlipidemia   . Hypertension     MEDICATIONS:  Prior to Admission medications   Medication Sig Start Date End Date Taking? Authorizing Provider  acetaminophen (TYLENOL) 325 MG tablet Take 650 mg by mouth every 6 (six) hours as needed for mild pain.    [provider]  busPIRone (BUSPAR) 5 MG tablet Take 5 mg by mouth 2 (two) times daily.    [provider]  divalproex (DEPAKOTE) 250 MG DR tablet Take 250 mg by mouth 2 (two) times daily.    [provider]  donepezil (ARICEPT) 10 MG tablet Take 10 mg by mouth daily.     [provider]  losartan (COZAAR) 100 MG tablet Take 100 mg by mouth daily.    [provider]  memantine (NAMENDA) 10 MG tablet Take 10 mg by mouth 2 (two) times daily.    [provider]  metoprolol tartrate (LOPRESSOR) 25 MG tablet Take 75 mg by mouth 2 (two) times daily.    [provider]  sertraline (ZOLOFT) 25 MG tablet TAKE 1 TABLET BY MOUTH EVERY DAY Patient taking differently: Take 25 mg by mouth daily.  01/08/14   Ricard Dillon, MD    ALLERGIES:  No Known Allergies  SOCIAL HISTORY:  Social History   Tobacco Use  . Smoking status: Never Smoker  . Smokeless tobacco: Never Used  Substance  Use Topics  . Alcohol use: Yes    FAMILY HISTORY: Family History  Problem Relation Age of Onset  . Heart disease Mother   . Heart disease Father   . Hyperlipidemia Father   . Hypertension Father   . Coronary artery disease Other     EXAM: BP (!) 175/88   Pulse 61   Temp 97.8 F (36.6 C) (Oral)   Resp 14   SpO2 98%  CONSTITUTIONAL: Patient is awake and alert.  Does not answer questions appropriately.  Patient is demented.  Elderly, resting in bed, does not appear in distress HEAD: Normocephalic; wound to the left eyebrow that has healed without signs of superimposed infection, abrasion to the posterior scalp EYES: Conjunctivae clear, PERRL, EOMI ENT: normal nose; no rhinorrhea; moist mucous membranes; pharynx without lesions noted; no dental injury; no septal hematoma NECK: Supple, no meningismus, no LAD; no midline spinal tenderness, step-off or deformity; trachea midline, cervical collar in place CARD: RRR; S1 and S2 appreciated; no murmurs, no clicks, no rubs, no gallops RESP: Normal chest excursion without splinting or tachypnea; breath sounds clear and equal bilaterally; no wheezes, no rhonchi, no rales; no hypoxia or respiratory distress CHEST:  chest wall stable, no crepitus or ecchymosis or deformity, nontender to palpation; no flail chest ABD/GI: Normal bowel sounds; non-distended; soft, non-tender, no rebound, no guarding; no ecchymosis or other lesions  noted PELVIS:  stable, nontender to palpation BACK:  The back appears normal and is non-tender to palpation, there is no CVA tenderness; no midline spinal tenderness, step-off or deformity EXT: Normal ROM in all joints; non-tender to palpation; no edema; normal capillary refill; no cyanosis, no bony tenderness or bony deformity of patient's extremities, no joint effusion, compartments are soft, extremities are warm and well-perfused, no ecchymosis SKIN: Normal color for age and race; warm, 4 cm skin tear to the left  elbow NEURO: Moves all extremities equally, no noted facial asymmetry  PSYCH: The patient's mood and manner are appropriate. Grooming and personal hygiene are appropriate.  MEDICAL DECISION MAKING: Patient here after a fall.  Will obtain CT of the head and cervical spine.  Abrasion to the posterior scalp.  Tetanus vaccination up-to-date.  Attempted to call [facility] for more information x 2 with no answer.  No other sign of injury other than skin tear to the left elbow but appears to have full range of motion in this joint without bony tenderness or deformity.  Will clean wound and apply nonadherent sterile dressing.  ED PROGRESS: Spoke with patient's wife who reports that [facility] called her and told her that he was found on the floor outside of the bed.  He is mostly bedbound and wheelchair-bound.  He has had frequent falls out of bed.  Updated her with results.  She is comfortable with plan to discharge back to [facility].   At this time, I do not feel there is any life-threatening condition present. I have reviewed, interpreted and discussed all results (EKG, imaging, lab, urine as appropriate) and exam findings with patient/family. I have reviewed nursing notes and appropriate previous records.  I feel the patient is safe to be discharged home without further emergent workup and can continue workup as an outpatient as needed. Discussed usual and customary return precautions. Patient/family verbalize understanding and are comfortable with this plan.  Outpatient follow-up has been provided as needed. All questions have been answered.    Jaiven Sandusky Zambrana was evaluated in Emergency Department on 10/22/2019 for the symptoms described in the history of present illness. He was evaluated in the context of the global COVID-19 pandemic, which necessitated consideration that the patient might be at risk for infection with the SARS-CoV-2 virus that causes COVID-19. Institutional protocols and  algorithms that pertain to the evaluation of patients at risk for COVID-19 are in a state of rapid change based on information released by regulatory bodies including the CDC and federal and state organizations. These policies and algorithms were followed during the patient's care in the ED.  Patient was seen wearing N95, face shield, gloves.     Daylani Deblois, Delice Bison, DO 10/23/19 [phone]

## 2019-10-23 ENCOUNTER — Emergency Department (HOSPITAL_COMMUNITY): Payer: Medicare Other

## 2019-10-23 MED ORDER — BACITRACIN ZINC 500 UNIT/GM EX OINT
TOPICAL_OINTMENT | CUTANEOUS | Status: AC
  Filled 2019-10-23: qty 2.7

## 2019-10-23 NOTE — ED Notes (Signed)
[facility] called for transport back to facility 

## 2019-10-23 NOTE — ED Notes (Signed)
Called to update spouse, Angela Nevin. She asked that we update her in the morning for anything further.

## 2019-10-23 NOTE — Discharge Instructions (Addendum)
CT of the head and cervical spine showed no acute injury.  Abrasion to the back of the head but no laceration that needs sutures or staples.

## 2019-12-12 ENCOUNTER — Emergency Department (HOSPITAL_COMMUNITY)

## 2019-12-12 ENCOUNTER — Other Ambulatory Visit: Payer: Self-pay

## 2019-12-12 ENCOUNTER — Encounter (HOSPITAL_COMMUNITY): Payer: Self-pay

## 2019-12-12 ENCOUNTER — Emergency Department (HOSPITAL_COMMUNITY)
Admission: EM | Admit: 2019-12-12 | Discharge: 2019-12-12 | Disposition: A | Discharge status: Home / Self Care | Attending: Emergency Medicine | Admitting: Emergency Medicine

## 2019-12-12 DIAGNOSIS — S0181XA Laceration without foreign body of other part of head, initial encounter: Secondary | ICD-10-CM | POA: Diagnosis present

## 2019-12-12 DIAGNOSIS — F0390 Unspecified dementia without behavioral disturbance: Secondary | ICD-10-CM | POA: Insufficient documentation

## 2019-12-12 DIAGNOSIS — Y92239 Unspecified place in hospital as the place of occurrence of the external cause: Secondary | ICD-10-CM | POA: Diagnosis not present

## 2019-12-12 DIAGNOSIS — I10 Essential (primary) hypertension: Secondary | ICD-10-CM | POA: Diagnosis not present

## 2019-12-12 DIAGNOSIS — Y939 Activity, unspecified: Secondary | ICD-10-CM | POA: Diagnosis not present

## 2019-12-12 DIAGNOSIS — I2510 Atherosclerotic heart disease of native coronary artery without angina pectoris: Secondary | ICD-10-CM | POA: Insufficient documentation

## 2019-12-12 DIAGNOSIS — Z8673 Personal history of transient ischemic attack (TIA), and cerebral infarction without residual deficits: Secondary | ICD-10-CM | POA: Insufficient documentation

## 2019-12-12 DIAGNOSIS — Y999 Unspecified external cause status: Secondary | ICD-10-CM | POA: Insufficient documentation

## 2019-12-12 DIAGNOSIS — Z79899 Other long term (current) drug therapy: Secondary | ICD-10-CM | POA: Diagnosis not present

## 2019-12-12 DIAGNOSIS — E039 Hypothyroidism, unspecified: Secondary | ICD-10-CM | POA: Diagnosis not present

## 2019-12-12 DIAGNOSIS — W19XXXA Unspecified fall, initial encounter: Secondary | ICD-10-CM | POA: Diagnosis not present

## 2019-12-12 DIAGNOSIS — R1310 Dysphagia, unspecified: Secondary | ICD-10-CM | POA: Diagnosis not present

## 2019-12-12 HISTORY — DX: Cerebral infarction, unspecified: I63.9

## 2019-12-12 HISTORY — DX: Malignant (primary) neoplasm, unspecified: C80.1

## 2019-12-12 NOTE — ED Provider Notes (Signed)
  Provider Note MRN:  [id number]  Arrival date & time: 12/12/19    ED Course and Medical Decision Making  Assumed care from [facility] at shift change.  Patient exhibited no signs of swelling that she is here in the emergency department.  While preparing for discharge, patient was found on the ground with a laceration to his right brow.  Unwitnessed fall.  Laceration repaired as described above.  CT head is without acute process.  Discussed this issue again with patient's spouse, who is understanding and explains that patient falls extremely often due to his dementia.  Patient is still appropriate for discharge back to his care facility.  Marland Kitchen.Laceration Repair  Date/Time: 12/12/2019 10:10 PM Performed by: Maudie Flakes, MD Authorized by: Maudie Flakes, MD   Consent:    Consent obtained:  Verbal   Consent given by:  Spouse Anesthesia (see MAR for exact dosages):    Anesthesia method:  None Laceration details:    Location:  Face   Face location:  R eyebrow   Length (cm):  2   Depth (mm):  1 Repair type:    Repair type:  Simple Pre-procedure details:    Preparation:  Patient was prepped and draped in usual sterile fashion Exploration:    Hemostasis achieved with:  Direct pressure   Wound exploration: wound explored through full range of motion     Contaminated: no   Treatment:    Area cleansed with:  Saline Skin repair:    Repair method:  Tissue adhesive Approximation:    Approximation:  Close Post-procedure details:    Dressing:  Open (no dressing)   Patient tolerance of procedure:  Tolerated well, no immediate complications    Final Clinical Impressions(s) / ED Diagnoses     ICD-10-CM   1. Difficulty swallowing liquids  R13.10   2. Fall, initial encounter  W19.XXXA   3. Laceration of brow without complication, initial encounter  S01.81XA     ED Discharge Orders    None        Discharge Instructions     Patient has had no difficulty swallowing here in the ED.   X-rays were without acute abnormalities. Should patient need his swallowing further evaluated, he may follow-up with a primary care provider or gastroenterologist.  Patient fell here in the emergency department.  CT scan of the head was normal.  The laceration was repaired with medical glue, which will slowly wear away with time and allow the laceration to heal.    Barth Kirks. [place]onia Small, [place] [email or web address]    Maudie Flakes, MD 12/12/19 2212

## 2019-12-12 NOTE — ED Triage Notes (Signed)
Per EMS- patient is from [facility]. Patient had [place] sausage last night and c/o throat pain today. Staff gave him breakfast and was unable to keep down. Staff gave the patient liquids for lunch and was unable to keep it down. Patient is able to swallow his own saliva.

## 2019-12-12 NOTE — ED Notes (Signed)
Report called to [employer].

## 2019-12-12 NOTE — ED Notes (Signed)
Patient transported to CT. [place] at bedside.

## 2019-12-12 NOTE — Discharge Instructions (Addendum)
Patient has had no difficulty swallowing here in the ED.  X-rays were without acute abnormalities. Should patient need his swallowing further evaluated, he may follow-up with a primary care provider or gastroenterologist.  Patient fell here in the emergency department.  CT scan of the head was normal.  The laceration was repaired with medical glue, which will slowly wear away with time and allow the laceration to heal.

## 2019-12-12 NOTE — ED Provider Notes (Addendum)
[facility] DEPT Provider Note   CSN: [id number] Arrival date & time: 12/12/19  1529     History Chief Complaint  Patient presents with  . food  in throat    Jermaine Castaneda is a 85 y.o. male.  HPI      Level 5 caveat due to dementia.  Jermaine Castaneda is a 85 y.o. male, with a history of CAD, dementia, hyperlipidemia, HTN, stroke, presenting to the ED with concern for food impaction. Patient from skilled nursing facility.  Food and drink came up shortly after the patient trying to swallow it throughout the day today. Patient denies any pain or complaints.     Past Medical History:  Diagnosis Date  . CAD (coronary artery disease)   . Cancer ([place])   . Dementia ([place]) 10/15/2019   source: [facility]  . Hyperlipidemia   . Hypertension   . Stroke [facility])     Patient Active Problem List   Diagnosis Date Noted  . Cerebrovascular disease 10/11/2013  . Hemorrhoids, external with complications [id number]  . DUMPING SYNDROME 03/16/2010  . MALIGNANT NEOPLASM OF COLON UNSPECIFIED SITE 02/11/2010  . COLONIC POLYPS 12/11/2009  . ACTINIC KERATOSIS 07/15/2009  . LOC OSTEOARTHROS NOT SPEC PRIM/SEC LOWER LEG 10/02/2007  . HYPOTHYROIDISM 09/26/2007  . ANEMIA 09/26/2007  . PSA, INCREASED 09/26/2007  . HYPERLIPIDEMIA 04/27/2007  . HYPERTENSION 04/27/2007  . CORONARY ARTERY DISEASE 04/27/2007    Past Surgical History:  Procedure Laterality Date  . CORONARY ARTERY BYPASS GRAFT    . CORONARY ARTERY BYPASS GRAFT  1996  . TOTAL KNEE ARTHROPLASTY  2003       Family History  Problem Relation Age of Onset  . Heart disease Mother   . Heart disease Father   . Hyperlipidemia Father   . Hypertension Father   . Coronary artery disease Other     Social History   Tobacco Use  . Smoking status: Never Smoker  . Smokeless tobacco: Never Used  Substance Use Topics  . Alcohol use: Not Currently  . Drug use: No    Home  Medications Prior to Admission medications   Medication Sig Start Date End Date Taking? Authorizing Provider  acetaminophen (TYLENOL) 325 MG tablet Take 650 mg by mouth every 6 (six) hours as needed for mild pain.   Yes [provider]  busPIRone (BUSPAR) 10 MG tablet Take 10 mg by mouth 2 (two) times daily.    Yes [provider]  cetirizine (ZYRTEC) 10 MG tablet Take 10 mg by mouth daily.   Yes [provider]  liver oil-zinc oxide (DESITIN) 40 % ointment Apply 1 application topically as needed for irritation.   Yes [provider]  loperamide (IMODIUM) 2 MG capsule Take 4 mg by mouth as needed for diarrhea or loose stools.   Yes [provider]  LORazepam (ATIVAN) 0.5 MG tablet Take 0.5 mg by mouth every 6 (six) hours as needed for anxiety.   Yes [provider]  losartan (COZAAR) 100 MG tablet Take 100 mg by mouth daily.   Yes [provider]  memantine (NAMENDA) 10 MG tablet Take 10 mg by mouth 2 (two) times daily.   Yes [provider]  metoprolol tartrate (LOPRESSOR) 25 MG tablet Take 75 mg by mouth 2 (two) times daily.   Yes [provider]  sertraline (ZOLOFT) 25 MG tablet TAKE 1 TABLET BY MOUTH EVERY DAY Patient taking differently: Take 25 mg by mouth  daily.  01/08/14  Yes Ricard Dillon, MD    Allergies    Patient has no known allergies.  Review of Systems   Review of Systems  Unable to perform ROS: Dementia    Physical Exam Updated Vital Signs BP (!) 135/99 (BP Location: Left Arm)   Pulse (!) 59   Temp 97.9 F (36.6 C) (Oral)   Resp 16   SpO2 98%   Physical Exam Vitals and nursing note reviewed.  Constitutional:      General: He is not in acute distress.    Appearance: He is well-developed. He is not diaphoretic.  HENT:     Head: Normocephalic and atraumatic.     Mouth/Throat:     Mouth: Mucous membranes are moist.     Pharynx: Oropharynx is clear.     Comments: No noted area of  intraoral swelling or fluctuance.  No trismus or noted abnormal phonation.  Mouth opening to at least 3 finger widths.  Handles oral secretions without difficulty.  No noted facial swelling.  No sublingual swelling.  No swelling or tenderness to the submental or submandibular regions.  No swelling or tenderness into the soft tissues of the neck. Eyes:     Conjunctiva/sclera: Conjunctivae normal.  Cardiovascular:     Rate and Rhythm: Normal rate and regular rhythm.     Pulses: Normal pulses.          Radial pulses are 2+ on the right side and 2+ on the left side.       Posterior tibial pulses are 2+ on the right side and 2+ on the left side.     Heart sounds: Normal heart sounds.     Comments: Tactile temperature in the extremities appropriate and equal bilaterally. Pulmonary:     Effort: Pulmonary effort is normal. No respiratory distress.     Breath sounds: Normal breath sounds.  Abdominal:     Palpations: Abdomen is soft.     Tenderness: There is no abdominal tenderness. There is no guarding.  Musculoskeletal:     Cervical back: Neck supple.     Right lower leg: No edema.     Left lower leg: No edema.  Lymphadenopathy:     Cervical: No cervical adenopathy.  Skin:    General: Skin is warm and dry.  Neurological:     Mental Status: He is alert.     Comments: Patient seems to be quite confused, consistent with his history of dementia. He follows simple commands without noted difficulty.  Psychiatric:        Mood and Affect: Mood and affect normal.        Speech: Speech normal.        Behavior: Behavior normal.     ED Results / Procedures / Treatments   Labs (all labs ordered are listed, but only abnormal results are displayed) Labs Reviewed - No data to display  EKG None  Radiology DG Neck Soft Tissue  Result Date: 12/12/2019 CLINICAL DATA:  Difficulty swallowing EXAM: NECK SOFT TISSUES - 1+ VIEW COMPARISON:  Cervical CT 10/23/2019 FINDINGS: There is no evidence of  retropharyngeal soft tissue swelling or epiglottic enlargement. The cervical airway is unremarkable and no radio-opaque foreign body identified. IMPRESSION: Negative. Electronically Signed   By: Donavan Foil M.D.   On: 12/12/2019 19:49   DG Chest 2 View  Result Date: 12/12/2019 CLINICAL DATA:  Throat pain vomiting EXAM: CHEST - 2 VIEW COMPARISON:  01/10/2019 FINDINGS: Post sternotomy changes. No focal  opacity or pleural effusion. Stable cardiomediastinal silhouette with aortic atherosclerosis and calcified hilar nodes. No pneumothorax. Multiple old left-sided rib fractures. IMPRESSION: No active cardiopulmonary disease. Electronically Signed   By: Donavan Foil M.D.   On: 12/12/2019 19:46    Procedures Procedures (including critical care time)  Medications Ordered in ED Medications - No data to display  ED Course  I have reviewed the triage vital signs and the nursing notes.  Pertinent labs & imaging results that were available during my care of the patient were reviewed by me and considered in my medical decision making (see chart for details).  Clinical Course as of Dec 11 2098  Wed Dec 12, 2019  [id number] Patient given water. Observed drinking water with no noted hesitation, discomfort, or regurgitation.   [SJ]  1925 Patient has been able to keep the water down without vomiting.   [SJ]  2019 Spoke with patient's wife, Jermaine Castaneda.  Described his presenting mental status and she confirms this is normal for him, especially over the last 6 months.  Reviewed presenting complaint, physical exam findings,  ED course, and imaging results, as well as plan for discharge.  She agrees with this plan.   [SJ]  2058 RN reports patient was found on the floor here in the ED before he could be discharged.   [SJ]    Clinical Course User Index [SJ] Maron Stanzione, Helane Gunther, PA-C   MDM Rules/Calculators/A&P                      Patient presents with reported difficulty swallowing liquids. He was able to swallow  liquids without noted difficulty throughout his ED course. No acute abnormalities on imaging studies.  Plan was for patient to be returned to the nursing facility, however, while awaiting transport back to the nursing facility, patient was found on the floor.  Assessment and further care performed by Dr. Sedonia Small at the end of my shift.  Findings and plan of care discussed with Gerlene Fee, MD. Dr. Sedonia Small personally evaluated and examined this patient.   Vitals:   12/12/19 1540 12/12/19 1606 12/12/19 2008  BP:  (!) 135/99 (!) 167/87  Pulse:  (!) 59 (!) 57  Resp:  16 17  Temp:  97.9 F (36.6 C)   TempSrc:  Oral   SpO2: 96% 98% 99%     Final Clinical Impression(s) / ED Diagnoses Final diagnoses:  Difficulty swallowing liquids    Rx / DC Orders ED Discharge Orders    None       Layla Maw 12/12/19 2032    Lorayne Bender, PA-C 12/12/19 2100    Maudie Flakes, MD 12/12/19 [id number]

## 2019-12-14 NOTE — ED Notes (Addendum)
Patient was found lying on the floor at the bottom of the bed. Pt was calling out for help. He stated his ride Environmental consultant) was taking too long and he was attempting to leave. Patient was assisted back to bed and alerted that it would be a minute before his ride could get here. Monitoring devices reattached. No neuro deficits found. Laceration above right eye. Sedonia Small, MD notified.

## 2019-12-29 ENCOUNTER — Encounter (HOSPITAL_COMMUNITY): Payer: Self-pay | Admitting: Emergency Medicine

## 2019-12-29 ENCOUNTER — Emergency Department (HOSPITAL_COMMUNITY)

## 2019-12-29 ENCOUNTER — Other Ambulatory Visit: Payer: Self-pay

## 2019-12-29 ENCOUNTER — Emergency Department (HOSPITAL_COMMUNITY)
Admission: EM | Admit: 2019-12-29 | Discharge: 2019-12-29 | Disposition: A | Discharge status: Home / Self Care | Attending: Emergency Medicine | Admitting: Emergency Medicine

## 2019-12-29 DIAGNOSIS — Y9389 Activity, other specified: Secondary | ICD-10-CM | POA: Diagnosis not present

## 2019-12-29 DIAGNOSIS — S0990XA Unspecified injury of head, initial encounter: Secondary | ICD-10-CM | POA: Diagnosis present

## 2019-12-29 DIAGNOSIS — Y999 Unspecified external cause status: Secondary | ICD-10-CM | POA: Diagnosis not present

## 2019-12-29 DIAGNOSIS — I119 Hypertensive heart disease without heart failure: Secondary | ICD-10-CM | POA: Diagnosis not present

## 2019-12-29 DIAGNOSIS — I160 Hypertensive urgency: Secondary | ICD-10-CM | POA: Insufficient documentation

## 2019-12-29 DIAGNOSIS — Y92129 Unspecified place in nursing home as the place of occurrence of the external cause: Secondary | ICD-10-CM | POA: Insufficient documentation

## 2019-12-29 DIAGNOSIS — W19XXXA Unspecified fall, initial encounter: Secondary | ICD-10-CM | POA: Diagnosis not present

## 2019-12-29 DIAGNOSIS — S0083XA Contusion of other part of head, initial encounter: Secondary | ICD-10-CM | POA: Insufficient documentation

## 2019-12-29 DIAGNOSIS — F0390 Unspecified dementia without behavioral disturbance: Secondary | ICD-10-CM | POA: Insufficient documentation

## 2019-12-29 DIAGNOSIS — I2510 Atherosclerotic heart disease of native coronary artery without angina pectoris: Secondary | ICD-10-CM | POA: Insufficient documentation

## 2019-12-29 LAB — BASIC METABOLIC PANEL
Anion gap: 4 — ABNORMAL LOW (ref 5–15)
BUN: 13 mg/dL (ref 8–23)
CO2: 30 mmol/L (ref 22–32)
Calcium: 9.1 mg/dL (ref 8.9–10.3)
Chloride: 107 mmol/L (ref 98–111)
Creatinine, Ser: 1.01 mg/dL (ref 0.61–1.24)
GFR calc Af Amer: >60 mL/min (ref >60)
GFR calc non Af Amer: >60 mL/min (ref >60)
Glucose, Bld: 98 mg/dL (ref 70–99)
Potassium: 3.9 mmol/L (ref 3.5–5.1)
Sodium: 141 mmol/L (ref 135–145)

## 2019-12-29 LAB — CBC
HCT: 39.7 % (ref 39.0–52.0)
Hemoglobin: 12.5 g/dL — ABNORMAL LOW (ref 13.0–17.0)
MCH: 29.2 pg (ref 26.0–34.0)
MCHC: 31.5 g/dL (ref 30.0–36.0)
MCV: 92.8 fL (ref 80.0–100.0)
Platelets: 210 10*3/uL (ref 150–400)
RBC: 4.28 MIL/uL (ref 4.22–5.81)
RDW: 14.5 % (ref 11.5–15.5)
WBC: 7.3 10*3/uL (ref 4.0–10.5)
nRBC: 0 % (ref 0.0–0.2)

## 2019-12-29 MED ORDER — LORAZEPAM 1 MG PO TABS
0.5000 mg | ORAL_TABLET | Freq: Once | ORAL | Status: AC
  Administered 2019-12-29: 0.5 mg via ORAL
  Filled 2019-12-29: qty 1

## 2019-12-29 MED ORDER — SODIUM CHLORIDE 0.9% FLUSH: 3.0000 mL | Freq: Once | INTRAVENOUS | Status: DC

## 2019-12-29 MED ORDER — METOPROLOL TARTRATE 25 MG PO TABS: 75.0000 mg | ORAL_TABLET | Freq: Once | ORAL | Status: DC

## 2019-12-29 NOTE — ED Provider Notes (Signed)
[facility] EMERGENCY DEPARTMENT Provider Note   CSN: [id number] Arrival date & time: 12/29/19  1256  LEVEL 5 CAVEAT - DEMENTIA  History Chief Complaint  Patient presents with  . ? Fall  . hematoma    Jermaine Castaneda is a 85 y.o. male.  HPI 85 year old male presents from [facility] after an unwitnessed fall.  History is limited due to the patient's significant dementia, no family or EMS currently present.  The patient reportedly developed bruising to his face at some point between third shift and this morning.  When the nurse aide found him this morning he was in his bed but had bruising to his right eye and scalp.  No obvious reports of a fall from the night shift.  The patient was given his medicines this morning.  Chart review indicates patient has been seen here multiple times for falls.  He has significant dementia and moans, does not speak normally.  No recent illness reported by the staff at the facility.  He went from EMS to the waiting room and when the nurse transferred him to the bed, he was able to take a couple steps to get into the stretcher and did not seem to be in pain.   Past Medical History:  Diagnosis Date  . CAD (coronary artery disease)   . Cancer ([place])   . Dementia ([place]) 10/15/2019   source: [facility]  . Hyperlipidemia   . Hypertension   . Stroke  [facility]     Patient Active Problem List   Diagnosis Date Noted  . Cerebrovascular disease 10/11/2013  . Hemorrhoids, external with complications [id number]  . DUMPING SYNDROME 03/16/2010  . MALIGNANT NEOPLASM OF COLON UNSPECIFIED SITE 02/11/2010  . COLONIC POLYPS 12/11/2009  . ACTINIC KERATOSIS 07/15/2009  . LOC OSTEOARTHROS NOT SPEC PRIM/SEC LOWER LEG 10/02/2007  . HYPOTHYROIDISM 09/26/2007  . ANEMIA 09/26/2007  . PSA, INCREASED 09/26/2007  . HYPERLIPIDEMIA 04/27/2007  . HYPERTENSION 04/27/2007  . CORONARY ARTERY DISEASE 04/27/2007    Past Surgical History:    Procedure Laterality Date  . CORONARY ARTERY BYPASS GRAFT    . CORONARY ARTERY BYPASS GRAFT  1996  . TOTAL KNEE ARTHROPLASTY  2003       Family History  Problem Relation Age of Onset  . Heart disease Mother   . Heart disease Father   . Hyperlipidemia Father   . Hypertension Father   . Coronary artery disease Other     Social History   Tobacco Use  . Smoking status: Never Smoker  . Smokeless tobacco: Never Used  Substance Use Topics  . Alcohol use: Not Currently  . Drug use: No    Home Medications Prior to Admission medications   Medication Sig Start Date End Date Taking? Authorizing Provider  acetaminophen (TYLENOL) 325 MG tablet Take 650 mg by mouth every 6 (six) hours as needed for mild pain.    [provider]  busPIRone (BUSPAR) 10 MG tablet Take 10 mg by mouth 2 (two) times daily.     [provider]  cetirizine (ZYRTEC) 10 MG tablet Take 10 mg by mouth daily.    [provider]  liver oil-zinc oxide (DESITIN) 40 % ointment Apply 1 application topically as needed for irritation.    [provider]  loperamide (IMODIUM) 2 MG capsule Take 4 mg by mouth as needed for diarrhea or loose stools.    [provider]  LORazepam (ATIVAN) 0.5 MG tablet Take 0.5 mg  by mouth every 6 (six) hours as needed for anxiety.    [provider]  losartan (COZAAR) 100 MG tablet Take 100 mg by mouth daily.    [provider]  memantine (NAMENDA) 10 MG tablet Take 10 mg by mouth 2 (two) times daily.    [provider]  metoprolol tartrate (LOPRESSOR) 25 MG tablet Take 75 mg by mouth 2 (two) times daily.    [provider]  sertraline (ZOLOFT) 25 MG tablet TAKE 1 TABLET BY MOUTH EVERY DAY Patient taking differently: Take 25 mg by mouth daily.  01/08/14   Ricard Dillon, MD    Allergies    Patient has no known allergies.  Review of Systems   Review of Systems  Unable to perform ROS: Dementia    Physical  Exam Updated Vital Signs BP (!) 188/94   Pulse (!) 59   Temp (!) 97.5 F (36.4 C) (Axillary)   Resp 20   SpO2 96%   Physical Exam Vitals and nursing note reviewed.  Constitutional:      General: He is not in acute distress.    Appearance: He is well-developed. He is not ill-appearing or diaphoretic.     Interventions: Cervical collar in place.  HENT:     Head: Normocephalic. Contusion present.      Right Ear: External ear normal.     Left Ear: External ear normal.     Nose: Nose normal.  Eyes:     General:        Right eye: No discharge.        Left eye: No discharge.     Pupils: Pupils are equal, round, and reactive to light.  Cardiovascular:     Rate and Rhythm: Regular rhythm. Bradycardia present.     Heart sounds: Normal heart sounds.  Pulmonary:     Effort: Pulmonary effort is normal.     Breath sounds: Normal breath sounds.  Abdominal:     General: There is no distension.     Palpations: Abdomen is soft.     Tenderness: There is no abdominal tenderness.  Musculoskeletal:     Cervical back: Neck supple.     Comments: No obvious upper or lower extremity trauma noted. No swelling, bruising or decreased passive ROM. No decreased ROM of hips  Skin:    General: Skin is warm and dry.  Neurological:     Mental Status: He is alert.     Comments: Patient moans when awakened. He does not otherwise speak or follow commands. When I place my fingers in his hands he grips both of them.  Psychiatric:        Mood and Affect: Mood is not anxious.     ED Results / Procedures / Treatments   Labs (all labs ordered are listed, but only abnormal results are displayed) Labs Reviewed  BASIC METABOLIC PANEL - Abnormal; Notable for the following components:      Result Value   Anion gap 4 (*)    All other components within normal limits  CBC - Abnormal; Notable for the following components:   Hemoglobin 12.5 (*)    All other components within normal limits    EKG EKG  Interpretation  Date/Time:  Saturday Dec 29 2019 13:43:36 EDT Ventricular Rate:  53 PR Interval:  214 QRS Duration: 78 QT Interval:  438 QTC Calculation: 410 R Axis:   78 Text Interpretation: Sinus bradycardia with 1st degree A-V block Septal infarct , age undetermined  Abnormal ECG no acute ST/T changes No old tracing to compare Confirmed by Sherwood Gambler [phone]) on 12/29/2019 2:57:07 PM   Radiology CT Head Wo Contrast  Result Date: 12/29/2019 CLINICAL DATA:  Found this morning with bruising to RIGHT eye, RIGHT frontal hematoma, questionable fall EXAM: CT HEAD WITHOUT CONTRAST CT MAXILLOFACIAL WITHOUT CONTRAST CT CERVICAL SPINE WITHOUT CONTRAST TECHNIQUE: Multidetector CT imaging of the head, cervical spine, and maxillofacial structures were performed using the standard protocol without intravenous contrast. Multiplanar CT image reconstructions of the cervical spine and maxillofacial structures were also generated. COMPARISON:  CT head 12/12/2019, CT cervical spine 10/23/2019, CT maxillofacial 10/15/2019 FINDINGS: CT HEAD FINDINGS Brain: Pronounced generalized atrophy. Normal ventricular morphology. No midline shift or mass effect. Small vessel chronic ischemic changes of deep cerebral white matter. No intracranial hemorrhage, mass lesion, evidence of acute infarction, or extra-axial fluid collection. Vascular: No hyperdense vessels. Atherosclerotic calcifications of internal carotid arteries at skull base Skull: RIGHT frontal scalp hematoma. Calvaria intact. Other: N/A CT MAXILLOFACIAL FINDINGS Osseous: Few scattered beam hardening artifacts of dental origin. Osseous demineralization. TMJ alignment normal. Facial bones intact. No facial bone fractures identified. Orbits: Bony orbits intact. Intraorbital soft tissue planes clear. Sinuses: Paranasal sinuses clear. Concha bullosa RIGHT middle turbinate. Soft tissues: RIGHT periorbital soft tissue swelling/contusion. RIGHT frontal scalp hematoma. CT  CERVICAL SPINE FINDINGS Alignment: Minimal retrolisthesis at C2-C3 unchanged. Remaining alignments normal. Skull base and vertebrae: Bones demineralized. Skull base intact. Sclerotic focus anterior LEFT C2 vertebral body at base of odontoid process unchanged, suspect bone island. Disc space narrowing with small endplate spurs at D34-534, C5-C6, C6-C7. Multilevel facet degenerative changes with scattered encroachment upon cervical neural foramina greater on LEFT. No fracture or bone destruction. Soft tissues and spinal canal: Prevertebral soft tissues normal thickness. Atherosclerotic calcifications at carotid bifurcations greater on RIGHT. Additional atherosclerotic calcifications at the great vessels. Disc levels:  No specific abnormalities Upper chest: Lung apices clear Other: N/A IMPRESSION: Atrophy with small vessel chronic ischemic changes of deep cerebral white matter. No acute intracranial abnormalities. RIGHT frontal scalp hematoma. No acute facial bone abnormalities. Degenerative disc and facet disease changes of the cervical spine. No acute cervical spine abnormalities. Electronically Signed   By: Lavonia Dana M.D.   On: 12/29/2019 17:49   CT Cervical Spine Wo Contrast  Result Date: 12/29/2019 CLINICAL DATA:  Found this morning with bruising to RIGHT eye, RIGHT frontal hematoma, questionable fall EXAM: CT HEAD WITHOUT CONTRAST CT MAXILLOFACIAL WITHOUT CONTRAST CT CERVICAL SPINE WITHOUT CONTRAST TECHNIQUE: Multidetector CT imaging of the head, cervical spine, and maxillofacial structures were performed using the standard protocol without intravenous contrast. Multiplanar CT image reconstructions of the cervical spine and maxillofacial structures were also generated. COMPARISON:  CT head 12/12/2019, CT cervical spine 10/23/2019, CT maxillofacial 10/15/2019 FINDINGS: CT HEAD FINDINGS Brain: Pronounced generalized atrophy. Normal ventricular morphology. No midline shift or mass effect. Small vessel chronic  ischemic changes of deep cerebral white matter. No intracranial hemorrhage, mass lesion, evidence of acute infarction, or extra-axial fluid collection. Vascular: No hyperdense vessels. Atherosclerotic calcifications of internal carotid arteries at skull base Skull: RIGHT frontal scalp hematoma. Calvaria intact. Other: N/A CT MAXILLOFACIAL FINDINGS Osseous: Few scattered beam hardening artifacts of dental origin. Osseous demineralization. TMJ alignment normal. Facial bones intact. No facial bone fractures identified. Orbits: Bony orbits intact. Intraorbital soft tissue planes clear. Sinuses: Paranasal sinuses clear. Concha bullosa RIGHT middle turbinate. Soft tissues: RIGHT periorbital soft tissue swelling/contusion. RIGHT frontal scalp hematoma. CT CERVICAL SPINE FINDINGS Alignment: Minimal retrolisthesis at C2-C3  unchanged. Remaining alignments normal. Skull base and vertebrae: Bones demineralized. Skull base intact. Sclerotic focus anterior LEFT C2 vertebral body at base of odontoid process unchanged, suspect bone island. Disc space narrowing with small endplate spurs at D34-534, C5-C6, C6-C7. Multilevel facet degenerative changes with scattered encroachment upon cervical neural foramina greater on LEFT. No fracture or bone destruction. Soft tissues and spinal canal: Prevertebral soft tissues normal thickness. Atherosclerotic calcifications at carotid bifurcations greater on RIGHT. Additional atherosclerotic calcifications at the great vessels. Disc levels:  No specific abnormalities Upper chest: Lung apices clear Other: N/A IMPRESSION: Atrophy with small vessel chronic ischemic changes of deep cerebral white matter. No acute intracranial abnormalities. RIGHT frontal scalp hematoma. No acute facial bone abnormalities. Degenerative disc and facet disease changes of the cervical spine. No acute cervical spine abnormalities. Electronically Signed   By: Lavonia Dana M.D.   On: 12/29/2019 17:49   CT Maxillofacial Wo  Contrast  Result Date: 12/29/2019 CLINICAL DATA:  Found this morning with bruising to RIGHT eye, RIGHT frontal hematoma, questionable fall EXAM: CT HEAD WITHOUT CONTRAST CT MAXILLOFACIAL WITHOUT CONTRAST CT CERVICAL SPINE WITHOUT CONTRAST TECHNIQUE: Multidetector CT imaging of the head, cervical spine, and maxillofacial structures were performed using the standard protocol without intravenous contrast. Multiplanar CT image reconstructions of the cervical spine and maxillofacial structures were also generated. COMPARISON:  CT head 12/12/2019, CT cervical spine 10/23/2019, CT maxillofacial 10/15/2019 FINDINGS: CT HEAD FINDINGS Brain: Pronounced generalized atrophy. Normal ventricular morphology. No midline shift or mass effect. Small vessel chronic ischemic changes of deep cerebral white matter. No intracranial hemorrhage, mass lesion, evidence of acute infarction, or extra-axial fluid collection. Vascular: No hyperdense vessels. Atherosclerotic calcifications of internal carotid arteries at skull base Skull: RIGHT frontal scalp hematoma. Calvaria intact. Other: N/A CT MAXILLOFACIAL FINDINGS Osseous: Few scattered beam hardening artifacts of dental origin. Osseous demineralization. TMJ alignment normal. Facial bones intact. No facial bone fractures identified. Orbits: Bony orbits intact. Intraorbital soft tissue planes clear. Sinuses: Paranasal sinuses clear. Concha bullosa RIGHT middle turbinate. Soft tissues: RIGHT periorbital soft tissue swelling/contusion. RIGHT frontal scalp hematoma. CT CERVICAL SPINE FINDINGS Alignment: Minimal retrolisthesis at C2-C3 unchanged. Remaining alignments normal. Skull base and vertebrae: Bones demineralized. Skull base intact. Sclerotic focus anterior LEFT C2 vertebral body at base of odontoid process unchanged, suspect bone island. Disc space narrowing with small endplate spurs at D34-534, C5-C6, C6-C7. Multilevel facet degenerative changes with scattered encroachment upon cervical  neural foramina greater on LEFT. No fracture or bone destruction. Soft tissues and spinal canal: Prevertebral soft tissues normal thickness. Atherosclerotic calcifications at carotid bifurcations greater on RIGHT. Additional atherosclerotic calcifications at the great vessels. Disc levels:  No specific abnormalities Upper chest: Lung apices clear Other: N/A IMPRESSION: Atrophy with small vessel chronic ischemic changes of deep cerebral white matter. No acute intracranial abnormalities. RIGHT frontal scalp hematoma. No acute facial bone abnormalities. Degenerative disc and facet disease changes of the cervical spine. No acute cervical spine abnormalities. Electronically Signed   By: Lavonia Dana M.D.   On: 12/29/2019 17:49    Procedures Procedures (including critical care time)  Medications Ordered in ED Medications  sodium chloride flush (NS) 0.9 % injection 3 mL (has no administration in time range)  metoprolol tartrate (LOPRESSOR) tablet 75 mg (has no administration in time range)  LORazepam (ATIVAN) tablet 0.5 mg (0.5 mg Oral Given 12/29/19 1630)    ED Course  I have reviewed the triage vital signs and the nursing notes.  Pertinent labs & imaging results that were available  during my care of the patient were reviewed by me and considered in my medical decision making (see chart for details).    MDM Rules/Calculators/A&P                      Patient has a history of frequent falls. CT imaging obtained and is unremarkable. Labs obtained in triage are benign. Discussed with wife, has significant dementia and frequent falls. He is also significantly hypertensive, though is resting comfortably, doubt this is symptomatic or hypertensive emergency. Will give his evening dose of metoprolol. D/c back to facility. Final Clinical Impression(s) / ED Diagnoses Final diagnoses:  Fall at nursing home, initial encounter  Contusion of forehead, initial encounter  Hypertensive urgency    Rx / DC  Orders ED Discharge Orders    None       Sherwood Gambler, MD 12/30/19 0126

## 2019-12-29 NOTE — ED Notes (Signed)
[facility] called @ 1828-per Minette Brine, RN called by Levada Dy

## 2019-12-29 NOTE — Discharge Instructions (Signed)
You were given your afternoon dose of metoprolol 75 mg while in the emergency department.

## 2019-12-29 NOTE — ED Notes (Signed)
Pt picked up by [facility]. VSS at d/c. AVS and transport form sent with EMS.

## 2019-12-29 NOTE — ED Triage Notes (Addendum)
Pt to triage via [facility] from [facility].  Pt was put to bed at 8pm last night.  Dayshift staff found pt this morning in bed with bruising to R eye and hematoma to R forehead.  ?? Fall.  No one had reported a fall from the evening shift.   Pt was holding R jaw on EMS arrival.  Pt normally altered.  Pt moaning which they reported as normal.  No blood thinners.  C-collar in place on arrival.

## 2019-12-29 NOTE — ED Notes (Signed)
Pt irritable and pulling off all monitors. Tele leads were all removed and found with pulse ox in his mouth. Pt not following requests to keep monitors in place. MD notified for possible need for sedation prior to CT scan

## 2020-05-07 ENCOUNTER — Encounter (HOSPITAL_COMMUNITY): Payer: Self-pay | Admitting: *Deleted

## 2020-05-07 ENCOUNTER — Emergency Department (HOSPITAL_COMMUNITY)
Admission: EM | Admit: 2020-05-07 | Discharge: 2020-05-07 | Disposition: A | Discharge status: Skilled Nursing Facility | Payer: Medicare Other | Attending: Emergency Medicine | Admitting: Emergency Medicine

## 2020-05-07 ENCOUNTER — Other Ambulatory Visit: Payer: Self-pay

## 2020-05-07 DIAGNOSIS — Z79899 Other long term (current) drug therapy: Secondary | ICD-10-CM | POA: Insufficient documentation

## 2020-05-07 DIAGNOSIS — W050XXA Fall from non-moving wheelchair, initial encounter: Secondary | ICD-10-CM | POA: Insufficient documentation

## 2020-05-07 DIAGNOSIS — Y999 Unspecified external cause status: Secondary | ICD-10-CM | POA: Insufficient documentation

## 2020-05-07 DIAGNOSIS — I2510 Atherosclerotic heart disease of native coronary artery without angina pectoris: Secondary | ICD-10-CM | POA: Diagnosis not present

## 2020-05-07 DIAGNOSIS — I10 Essential (primary) hypertension: Secondary | ICD-10-CM | POA: Insufficient documentation

## 2020-05-07 DIAGNOSIS — R410 Disorientation, unspecified: Secondary | ICD-10-CM | POA: Diagnosis not present

## 2020-05-07 DIAGNOSIS — Y939 Activity, unspecified: Secondary | ICD-10-CM | POA: Diagnosis not present

## 2020-05-07 DIAGNOSIS — Y929 Unspecified place or not applicable: Secondary | ICD-10-CM | POA: Diagnosis not present

## 2020-05-07 DIAGNOSIS — W19XXXA Unspecified fall, initial encounter: Secondary | ICD-10-CM

## 2020-05-07 IMAGING — CT CT HEAD WITHOUT CONTRAST
5 of 7 series · 17 of 47 positions shown, 18 images · non-contrast
Comparison: CT head 10/04/2013

CLINICAL DATA: Fall

EXAM:
CT HEAD WITHOUT CONTRAST
CT CERVICAL SPINE WITHOUT CONTRAST
TECHNIQUE: Multidetector CT imaging of the head and cervical spine was
performed following the standard protocol without intravenous
contrast. Multiplanar CT image reconstructions of the cervical spine
were also generated.

[Series 3: head wo · axial · 0.47mm/px · z∈[-81,-26]mm · 2 of 33 slices shown, 3 images]
[im 11/33  brain]
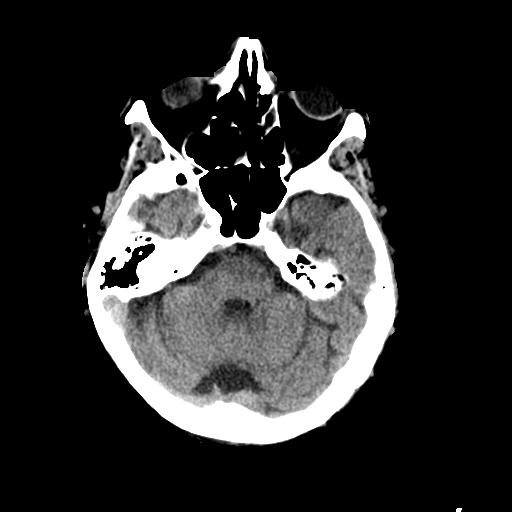
[im 11/33  bone]
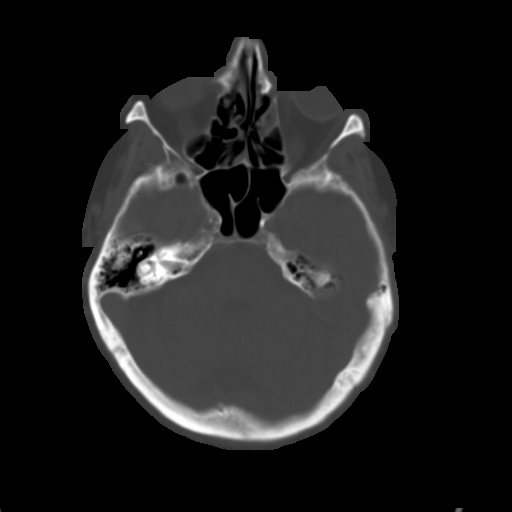
[im 22/33  brain]
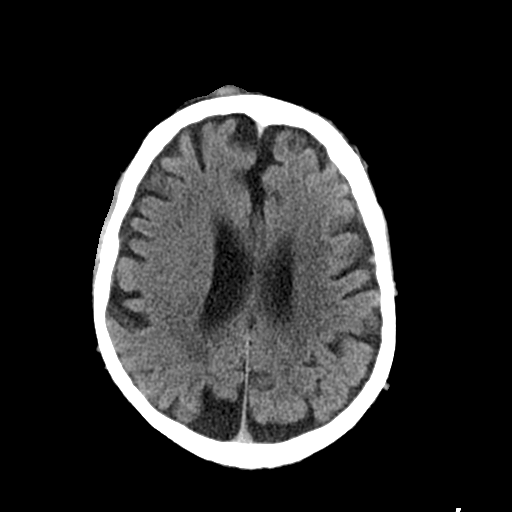

[Series 5: coronal soft tissue · coronal · 0.32mm/px · 3 of 69 slices shown]
[im 26/69  brain]
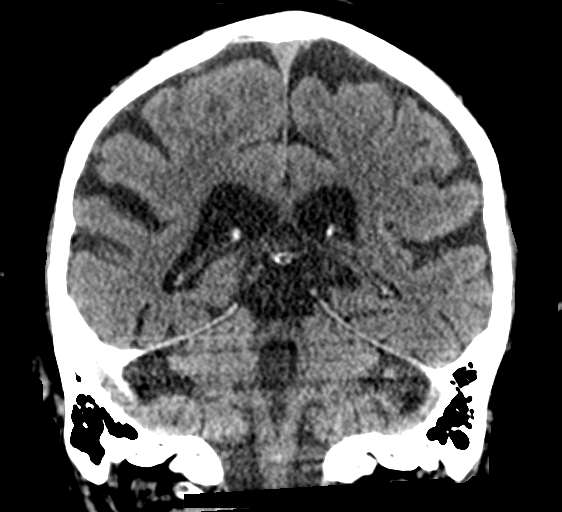
[im 35/69  brain]
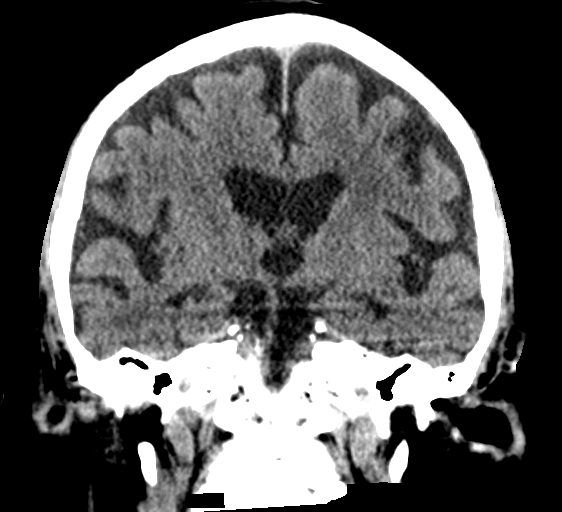
[im 43/69  brain]
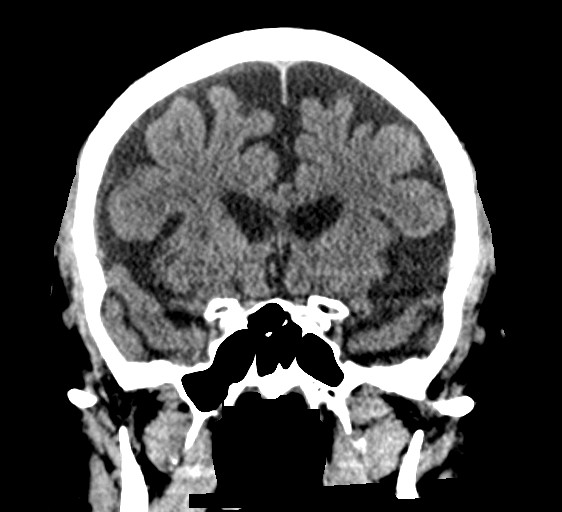

[Series 6: sagittal soft tissue · sagittal · 0.32mm/px · 1 of 52 slices shown]
[im 26/52  brain]
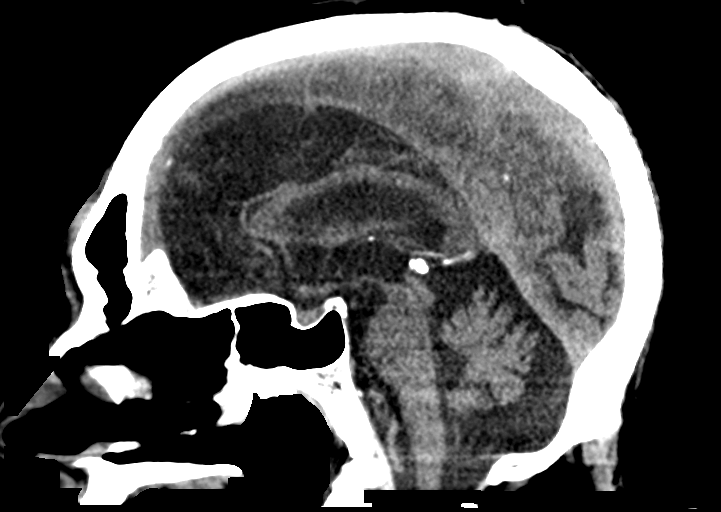

[Series 8: c spine soft · axial · 0.36mm/px · z∈[-296,-246]mm · 3 of 102 slices shown]
[im 9/102  brain]
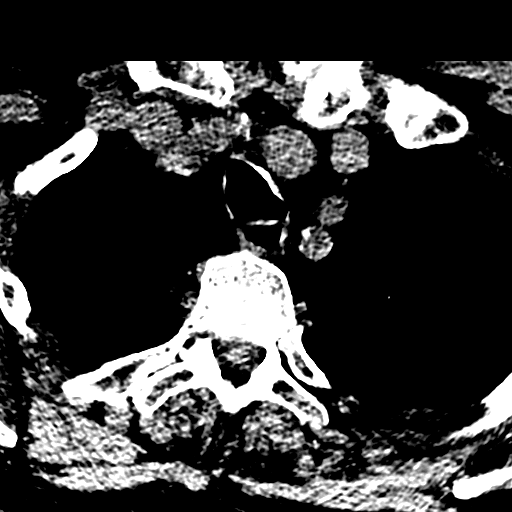
[im 26/102  brain]
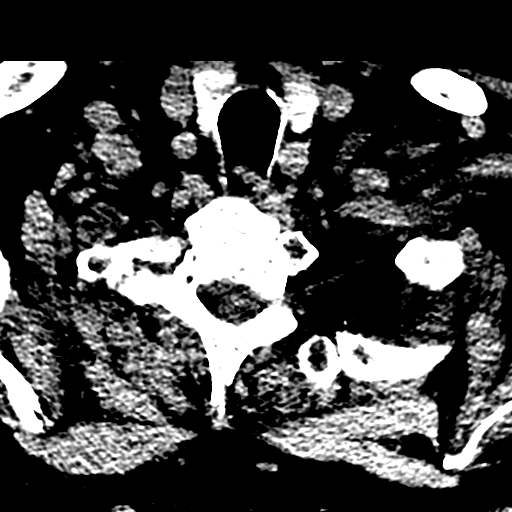
[im 34/102  brain]
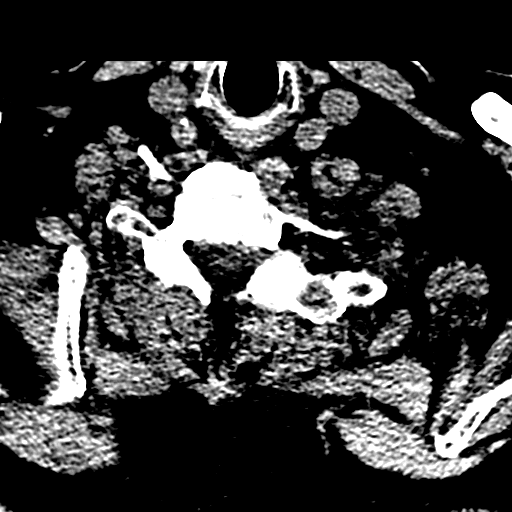

[Series 9: orthogonal bone · axial · 0.23mm/px · z∈[-318,-160]mm · 8 of 107 slices shown]
[im 9/107  bone]
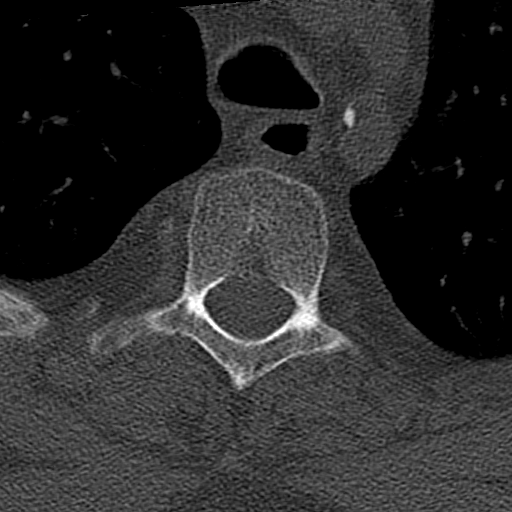
[im 27/107  bone]
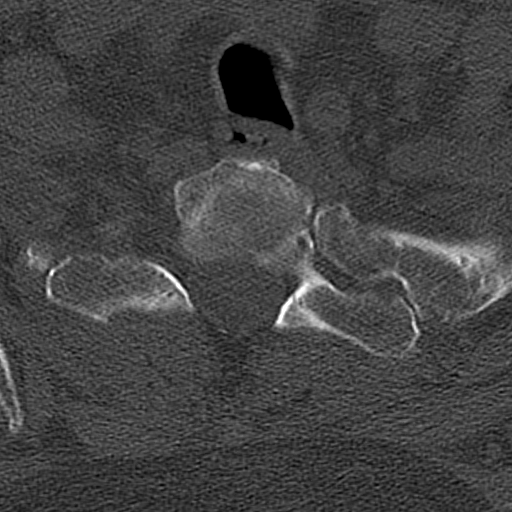
[im 36/107  bone]
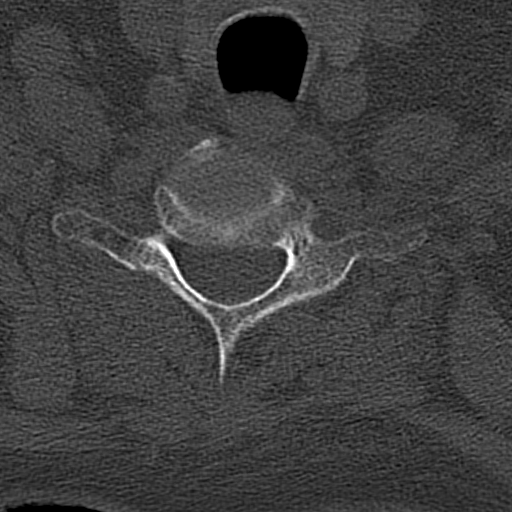
[im 45/107  bone]
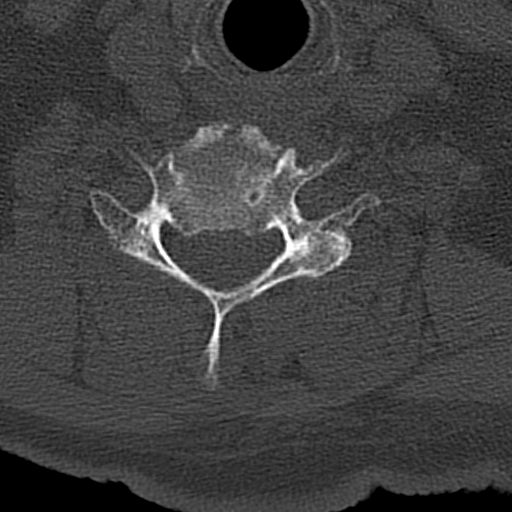
[im 62/107  bone]
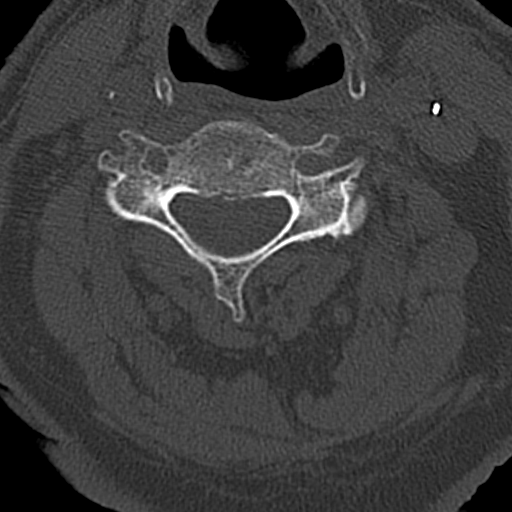
[im 71/107  bone]
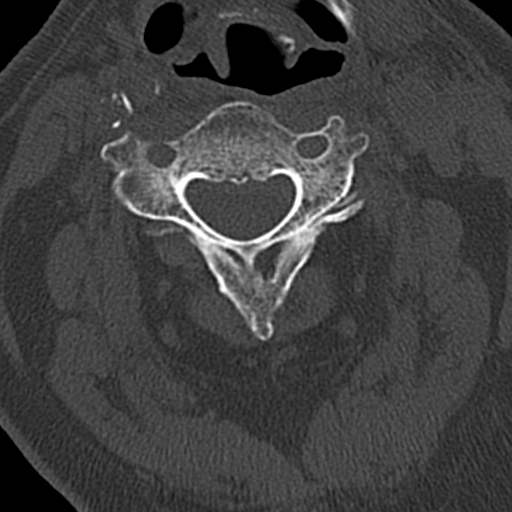
[im 80/107  bone]
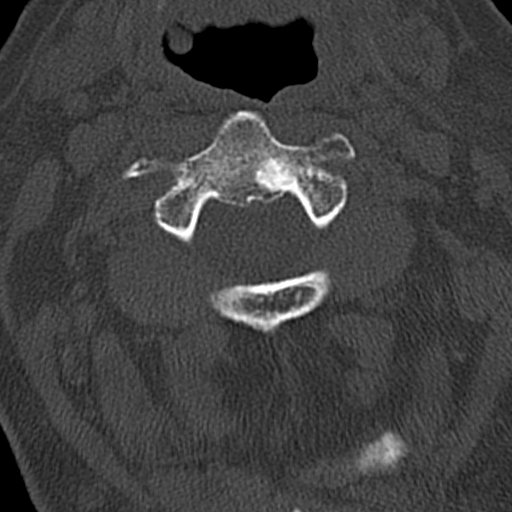
[im 98/107  bone]
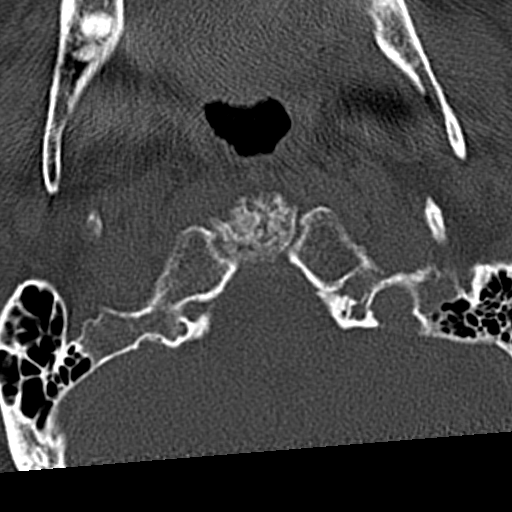

[17 of 47 positions shown; findings below may reference images not displayed]

FINDINGS: CT HEAD FINDINGS

Brain: There is no mass, hemorrhage or extra-axial collection. There
is generalized atrophy without lobar predilection. Areas of
hypoattenuation of the deep gray nuclei and confluent
periventricular white matter hypodensity, consistent with chronic
small vessel disease.

Vascular: Atherosclerotic calcification of the internal carotid
arteries at the skull base. No abnormal hyperdensity of the major
intracranial arteries or dural venous sinuses.

Skull: Right frontal scalp hematoma.  No skull fracture.

Sinuses/Orbits: No fluid levels or advanced mucosal thickening of
the visualized paranasal sinuses. No mastoid or middle ear effusion.
The orbits are normal.

CT CERVICAL SPINE FINDINGS

Alignment: No static subluxation. Facets are aligned. Occipital
condyles are normally positioned.

Skull base and vertebrae: No acute fracture. Sclerotic lesion at C2
is likely a bone island.

Soft tissues and spinal canal: No prevertebral fluid or swelling. No
visible canal hematoma.

Disc levels: Multilevel facet hypertrophy worst at C3-4 and C4-5. No
bony spinal canal stenosis.

Upper chest: No pneumothorax, pulmonary nodule or pleural effusion.

Other: Normal visualized paraspinal cervical soft tissues.
IMPRESSION: 1. Chronic ischemic microangiopathy and generalized volume loss
without acute intracranial abnormality.
2. Multilevel cervical degenerative disc and facet disease without
acute fracture or static subluxation.

## 2020-05-07 IMAGING — CR RIGHT SHOULDER - 2+ VIEW
3 series · 3 of 3 positions shown · non-contrast
Comparison: Chest radiograph dated 01/10/2019

CLINICAL DATA: 83-year-old male with fall and right shoulder pain.

EXAM:
RIGHT SHOULDER - 2+ VIEW

[x shoulder ap right (1 of 3)]
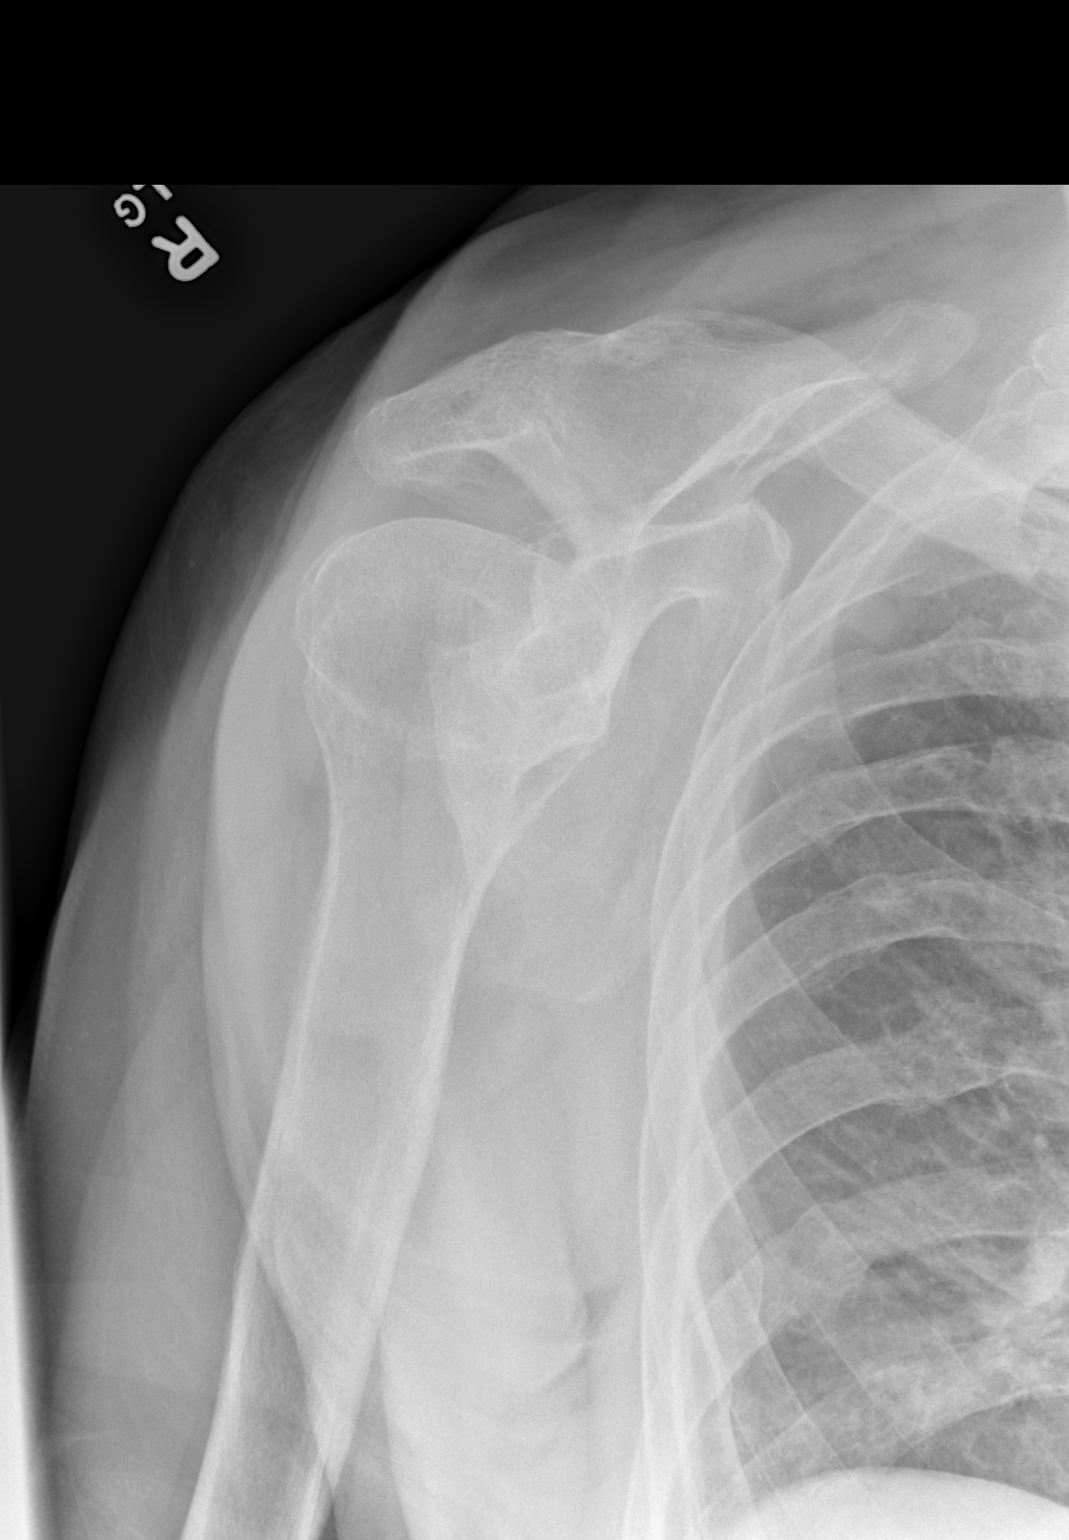

[x shoulder ap right (2 of 3)]
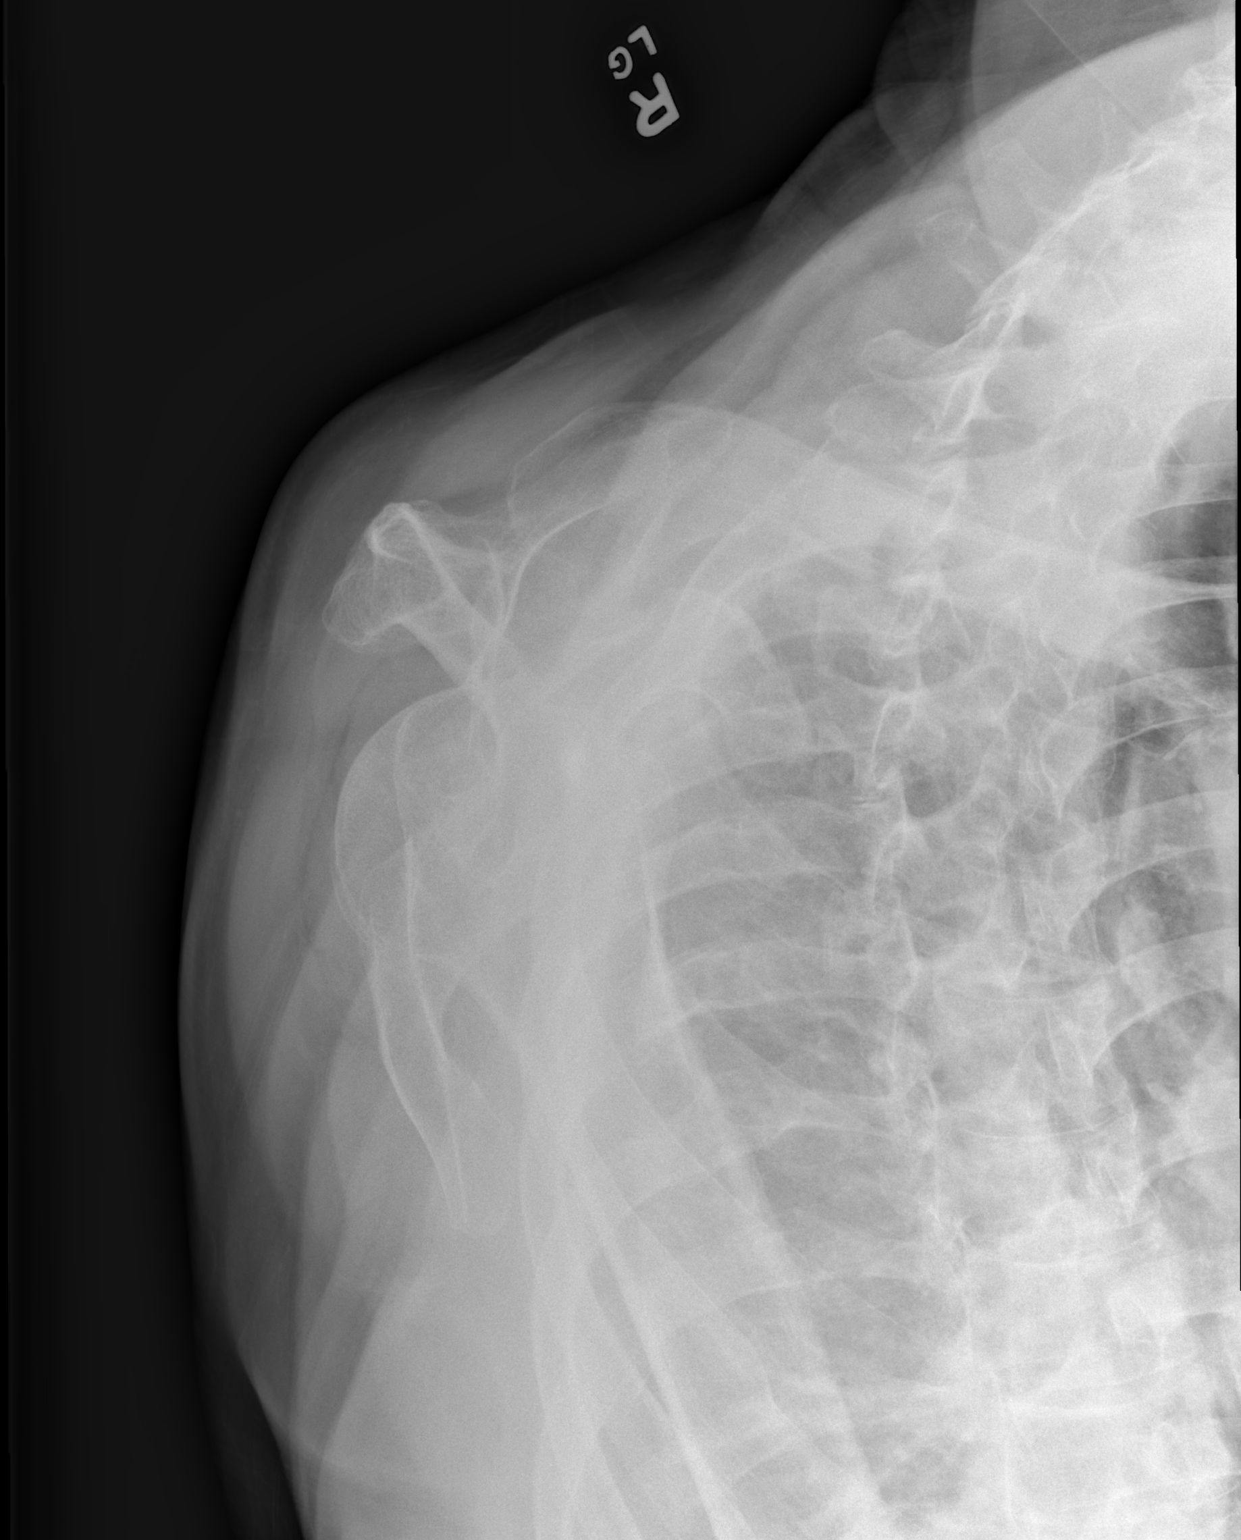

[x shoulder ap right (3 of 3)]
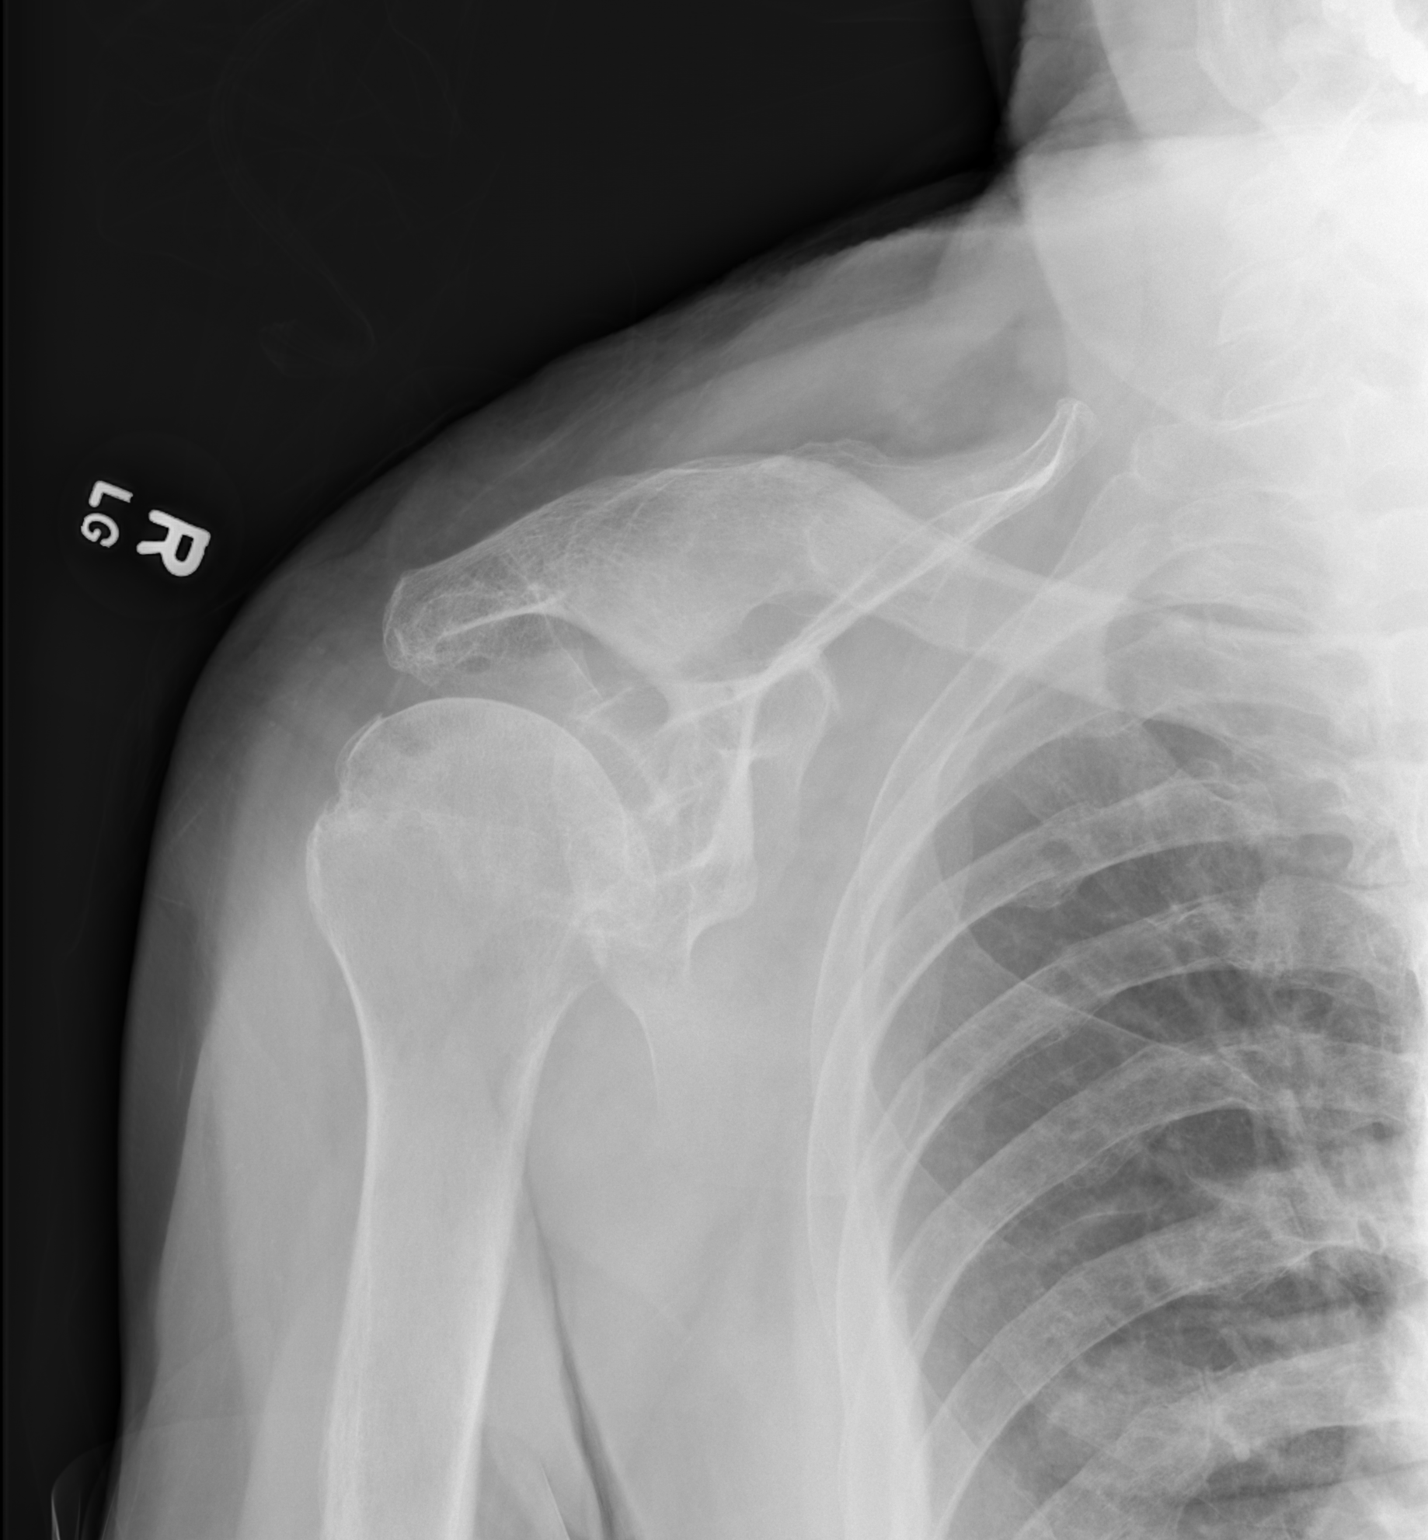

[3 of 3 positions shown; findings below may reference images not displayed]

FINDINGS: There is no acute fracture or dislocation. The bones are osteopenic.
Degenerative changes and chronic irregularity of the glenoid. Old
healed right posterior rib fractures. The soft tissues are
unremarkable.
IMPRESSION: No acute fracture or dislocation.

## 2020-05-07 NOTE — ED Notes (Signed)
[facility] called for transport.  

## 2020-05-07 NOTE — ED Notes (Signed)
Unable to reach anyone at [facility] place

## 2020-05-07 NOTE — ED Provider Notes (Signed)
[facility] EMERGENCY DEPARTMENT Provider Note  CSN: [id number] Arrival date & time: 05/07/20 1737    History Chief Complaint  Patient presents with  . Fall    HPI  Jermaine Castaneda is a 85 y.o. male with history of demential from ALF after he fell out of his wheelchair at dinner. Staff witnessed the fall, no reported head injury, but patient unable to give any history.    Past Medical History:  Diagnosis Date  . CAD (coronary artery disease)   . Cancer ([place])   . Dementia ([place]) 10/15/2019   source: [facility]  . Hyperlipidemia   . Hypertension   . Stroke [facility])     Past Surgical History:  Procedure Laterality Date  . CORONARY ARTERY BYPASS GRAFT    . CORONARY ARTERY BYPASS GRAFT  1996  . TOTAL KNEE ARTHROPLASTY  2003    Family History  Problem Relation Age of Onset  . Heart disease Mother   . Heart disease Father   . Hyperlipidemia Father   . Hypertension Father   . Coronary artery disease Other     Social History   Tobacco Use  . Smoking status: Never Smoker  . Smokeless tobacco: Never Used  Vaping Use  . Vaping Use: Never used  Substance Use Topics  . Alcohol use: Not Currently  . Drug use: No     Home Medications Prior to Admission medications   Medication Sig Start Date End Date Taking? Authorizing Provider  acetaminophen (TYLENOL) 325 MG tablet Take 650 mg by mouth every 6 (six) hours as needed for mild pain.    [provider]  busPIRone (BUSPAR) 10 MG tablet Take 10 mg by mouth 2 (two) times daily.     [provider]  cetirizine (ZYRTEC) 10 MG tablet Take 10 mg by mouth daily.    [provider]  liver oil-zinc oxide (DESITIN) 40 % ointment Apply 1 application topically as needed for irritation.    [provider]  loperamide (IMODIUM) 2 MG capsule Take 4 mg by mouth as needed for diarrhea or loose stools.    [provider]  LORazepam (ATIVAN) 0.5 MG tablet Take 0.5 mg by mouth  every 6 (six) hours as needed for anxiety.    [provider]  losartan (COZAAR) 100 MG tablet Take 100 mg by mouth daily.    [provider]  memantine (NAMENDA) 10 MG tablet Take 10 mg by mouth 2 (two) times daily.    [provider]  metoprolol tartrate (LOPRESSOR) 25 MG tablet Take 75 mg by mouth 2 (two) times daily.    [provider]  sertraline (ZOLOFT) 25 MG tablet TAKE 1 TABLET BY MOUTH EVERY DAY Patient taking differently: Take 25 mg by mouth daily.  01/08/14   Ricard Dillon, MD     Allergies    Patient has no known allergies.   Review of Systems   Review of Systems Unable to assess due to mental status.    Physical Exam BP (!) 188/88 (BP Location: Right Arm)   Pulse (!) 55   Temp 97.7 F (36.5 C) (Oral)   Resp 15   SpO2 100%   Physical Exam Vitals and nursing note reviewed.  Constitutional:      Appearance: Normal appearance.  HENT:     Head: Normocephalic and atraumatic.     Nose: Nose normal.     Mouth/Throat:     Mouth: Mucous membranes are moist.  Eyes:     Extraocular Movements: Extraocular movements intact.     Conjunctiva/sclera: Conjunctivae normal.  Neck:     Comments: No tenderness to midline Cardiovascular:     Rate and Rhythm: Normal rate.  Pulmonary:     Effort: Pulmonary effort is normal.     Breath sounds: Normal breath sounds.  Abdominal:     General: Abdomen is flat.     Palpations: Abdomen is soft.     Tenderness: There is no abdominal tenderness.  Musculoskeletal:        General: No swelling. Normal range of motion.     Cervical back: Neck supple.  Skin:    General: Skin is warm and dry.  Neurological:     General: No focal deficit present.     Mental Status: He is alert. He is disoriented.  Psychiatric:        Mood and Affect: Mood normal.      ED Results / Procedures / Treatments   Labs (all labs ordered are listed, but only abnormal results are displayed) Labs Reviewed - No data to  display  EKG None  Radiology No results found.  Procedures Procedures  Medications Ordered in the ED Medications - No data to display   MDM Rules/Calculators/A&P MDM Patient with no outward signs of trauma and at his baseline by report. I spoke with the patient's wife who requests no additional ED workup. He goes to Hospice as well. Will return to [facility]. Recommend close observation there and RTED for any concerning changes.  ED Course  I have reviewed the triage vital signs and the nursing notes.  Pertinent labs & imaging results that were available during my care of the patient were reviewed by me and considered in my medical decision making (see chart for details).     Final Clinical Impression(s) / ED Diagnoses Final diagnoses:  Fall, initial encounter    Rx / DC Orders ED Discharge Orders    None       Truddie Hidden, MD 05/07/20 [phone]

## 2020-05-07 NOTE — ED Triage Notes (Signed)
Pt was sitting in his wheelchair while in the dining room and fell out of wheelchair.  Staff didn't report seeing pt hit his head.  Pt is no on blood thinners.  Pt is at his baseline mentation which consists of moaning a lot. EMS placed c-collar on pt. EMS did not noted any obvious deformities.
